# Patient Record
Sex: Male | Born: 1980 | Race: Black or African American | Hispanic: No | Marital: Married | Smoking: Current every day smoker
Health system: Southern US, Community
[De-identification: ages and names within clinical notes are randomized; demographics above are authoritative.]

## PROBLEM LIST (undated history)

## (undated) DIAGNOSIS — Z720 Tobacco use: Secondary | ICD-10-CM

## (undated) DIAGNOSIS — E119 Type 2 diabetes mellitus without complications: Secondary | ICD-10-CM

## (undated) HISTORY — PX: CORNEAL TRANSPLANT: SHX108

## (undated) HISTORY — PX: HERNIA REPAIR: SHX51

---

## 2016-01-12 ENCOUNTER — Encounter (HOSPITAL_BASED_OUTPATIENT_CLINIC_OR_DEPARTMENT_OTHER): Payer: Self-pay

## 2016-01-12 ENCOUNTER — Emergency Department (HOSPITAL_BASED_OUTPATIENT_CLINIC_OR_DEPARTMENT_OTHER)
Admission: EM | Admit: 2016-01-12 | Discharge: 2016-01-12 | Disposition: A | Discharge status: Home / Self Care | Payer: Self-pay | Attending: Physician Assistant | Admitting: Physician Assistant

## 2016-01-12 ENCOUNTER — Emergency Department (HOSPITAL_BASED_OUTPATIENT_CLINIC_OR_DEPARTMENT_OTHER): Payer: Self-pay

## 2016-01-12 DIAGNOSIS — W2105XA Struck by basketball, initial encounter: Secondary | ICD-10-CM | POA: Insufficient documentation

## 2016-01-12 DIAGNOSIS — F17210 Nicotine dependence, cigarettes, uncomplicated: Secondary | ICD-10-CM | POA: Insufficient documentation

## 2016-01-12 DIAGNOSIS — Y9367 Activity, basketball: Secondary | ICD-10-CM | POA: Insufficient documentation

## 2016-01-12 DIAGNOSIS — S62637A Displaced fracture of distal phalanx of left little finger, initial encounter for closed fracture: Secondary | ICD-10-CM | POA: Insufficient documentation

## 2016-01-12 DIAGNOSIS — Y999 Unspecified external cause status: Secondary | ICD-10-CM | POA: Insufficient documentation

## 2016-01-12 DIAGNOSIS — S62609A Fracture of unspecified phalanx of unspecified finger, initial encounter for closed fracture: Secondary | ICD-10-CM

## 2016-01-12 DIAGNOSIS — Y929 Unspecified place or not applicable: Secondary | ICD-10-CM | POA: Insufficient documentation

## 2016-01-12 NOTE — ED Provider Notes (Signed)
[facility] Provider Note   CSN: [id number] Arrival date & time: 01/12/16  1525     History   Chief Complaint Chief Complaint  Patient presents with  . Finger Injury    HPI Jeffery Ray is a 35 y.o. male.  Patient presents to the emergency department with chief complaint of left finger pain. He states that he was playing basketball last night and jammed his finger. Complains of moderate pain. The pain is worsened with palpation and movement. He reports associated swelling and bruising. He has not tried taking anything for his symptoms. There are no other associated symptoms. He denies any hand pain. Denies any numbness, weakness, or tingling.   The history is provided by the patient. No language interpreter was used.    History reviewed. No pertinent past medical history.  There are no active problems to display for this patient.   Past Surgical History:  Procedure Laterality Date  . CORNEAL TRANSPLANT    . HERNIA REPAIR         Home Medications    Prior to Admission medications   Not on File    Family History No family history on file.  Social History Social History  Substance Use Topics  . Smoking status: Current Every Day Smoker  . Smokeless tobacco: Never Used  . Alcohol use No     Allergies   Fish allergy and Shellfish allergy   Review of Systems Review of Systems  Musculoskeletal: Positive for arthralgias.  All other systems reviewed and are negative.    Physical Exam Updated Vital Signs BP 109/72 (BP Location: Right Arm)   Pulse 105   Temp 98.2 F (36.8 C) (Oral)   Resp 18   Ht 6' (1.829 m)   Wt 86.2 kg   SpO2 98%   BMI 25.77 kg/m   Physical Exam Physical Exam  Constitutional: Pt appears well-developed and well-nourished. No distress.  HENT:  Head: Normocephalic and atraumatic.  Eyes: Conjunctivae are normal.  Neck: Normal range of motion.  Cardiovascular: Normal rate, regular rhythm and intact distal pulses.     Capillary refill < 3 sec  Pulmonary/Chest: Effort normal and breath sounds normal.  Musculoskeletal: Pt exhibits tenderness to palpation of left little finger (proximal and middle phalanx). Pt exhibits moderate edema.  ROM: 4/5 limited by pain  Neurological: Pt  is alert. Coordination normal.  Sensation 5/5 Strength 4/5 limited by pain  Skin: Skin is warm and dry. Pt is not diaphoretic.  No tenting of the skin  Psychiatric: Pt has a normal mood and affect.  Nursing note and vitals reviewed.   ED Treatments / Results  Labs (all labs ordered are listed, but only abnormal results are displayed) Labs Reviewed - No data to display  EKG  EKG Interpretation None       Radiology No results found.  Procedures Procedures (including critical care time)  Medications Ordered in ED Medications - No data to display   Initial Impression / Assessment and Plan / ED Course  I have reviewed the triage vital signs and the nursing notes.  Pertinent labs & imaging results that were available during my care of the patient were reviewed by me and considered in my medical decision making (see chart for details).  Clinical Course    Patient with left small finger middle phalanx avulsion fracture. Will give finger splint. Recommend orthopedic follow-up. Tylenol and ibuprofen for pain. Patient is stable and ready for discharge.  Final Clinical Impressions(s) / ED  Diagnoses   Final diagnoses:  Finger fracture, closed, initial encounter    New Prescriptions New Prescriptions   No medications on file     Roxy HorsemanRobert Asmaa Tirpak, PA-C 01/12/16 1613    Courteney Lyn Mackuen, MD 01/12/16 2320

## 2016-01-12 NOTE — ED Notes (Signed)
Pa  at bedside. 

## 2016-01-12 NOTE — ED Notes (Signed)
Patient transported to X-ray 

## 2016-01-12 NOTE — ED Triage Notes (Signed)
Injured left pinky finger yesterday playing basketball-swelling/bruisingNAD-steady gait

## 2016-10-04 ENCOUNTER — Encounter (HOSPITAL_BASED_OUTPATIENT_CLINIC_OR_DEPARTMENT_OTHER): Payer: Self-pay | Admitting: *Deleted

## 2016-10-04 ENCOUNTER — Emergency Department (HOSPITAL_BASED_OUTPATIENT_CLINIC_OR_DEPARTMENT_OTHER): Payer: Self-pay

## 2016-10-04 ENCOUNTER — Emergency Department (HOSPITAL_BASED_OUTPATIENT_CLINIC_OR_DEPARTMENT_OTHER)
Admission: EM | Admit: 2016-10-04 | Discharge: 2016-10-04 | Disposition: A | Discharge status: Home / Self Care | Payer: Self-pay | Attending: Emergency Medicine | Admitting: Emergency Medicine

## 2016-10-04 DIAGNOSIS — F17200 Nicotine dependence, unspecified, uncomplicated: Secondary | ICD-10-CM | POA: Insufficient documentation

## 2016-10-04 DIAGNOSIS — E119 Type 2 diabetes mellitus without complications: Secondary | ICD-10-CM | POA: Insufficient documentation

## 2016-10-04 DIAGNOSIS — M79672 Pain in left foot: Secondary | ICD-10-CM | POA: Insufficient documentation

## 2016-10-04 HISTORY — DX: Type 2 diabetes mellitus without complications: E11.9

## 2016-10-04 MED ORDER — NAPROXEN 250 MG PO TABS
500.0000 mg | ORAL_TABLET | Freq: Once | ORAL | Status: AC
  Administered 2016-10-04: 500 mg via ORAL
  Filled 2016-10-04: qty 2

## 2016-10-04 NOTE — ED Triage Notes (Signed)
Left foot pain onset 4-5 hours,  Denies inj.

## 2016-10-04 NOTE — ED Provider Notes (Signed)
[facility] Provider Note: Jeffery DellJ. Lane Jamira Barfuss, MD, FACEP  CSN: [id number] MRN: [id number] ARRIVAL: 10/04/16 at 0338 ROOM: MH05/[facility]05   CHIEF COMPLAINT  Foot Pain   HISTORY OF PRESENT ILLNESS  Jeffery Ray is a 36 y.o. male with diabetes. 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 He is not aware of any trauma. There is a small associated nodule associated with the pain which he said he has never noticed in the past. He rates his pain as a 7 out of 10 at its worst. Pain is exacerbated with palpation and ambulation. There is no gross deformity or swelling associated. The pain is sharp and well localized.   Past Medical History:  Diagnosis Date  . Diabetes mellitus without complication [facility](HCC)     Past Surgical History:  Procedure Laterality Date  . CORNEAL TRANSPLANT    . HERNIA REPAIR      No family history on file.  Social History  Substance Use Topics  . Smoking status: Current Every Day Smoker  . Smokeless tobacco: Never Used  . Alcohol use No    Prior to Admission medications   Not on File    Allergies Fish allergy and Shellfish allergy   REVIEW OF SYSTEMS  Negative except as noted here or in the History of Present Illness.   PHYSICAL EXAMINATION  Initial Vital Signs Blood pressure 112/75, pulse 70, temperature 97.6 F (36.4 C), temperature source Oral, resp. rate 16, height 6' (1.829 m), weight 190 lb (86.2 kg), SpO2 98 %.  Examination General: Well-developed, well-nourished male in no acute distress; appearance consistent with age of record HENT: normocephalic; atraumatic Eyes: Left pupil round and reactive to light; right pupil obscured by corneal opacity; mild right lateral strabismus Neck: supple Heart: regular rate and rhythm Lungs: clear to auscultation bilaterally Abdomen: soft; nondistended; nontender; bowel sounds present Extremities: No deformity;  full range of motion; pulses normal; tenderness of mid dorsal left foot at site of small firm nodule Neurologic: Awake, alert and oriented; motor function intact in all extremities and symmetric; no facial droop Skin: Warm and dry Psychiatric: Normal mood and affect   RESULTS  Summary of this visit's results, reviewed by myself:   EKG Interpretation  Date/Time:    Ventricular Rate:    PR Interval:    QRS Duration:   QT Interval:    QTC Calculation:   R Axis:     Text Interpretation:        Laboratory Studies: No results found for this or any previous visit (from the past 24 hour(s)). Imaging Studies: Dg Foot Complete Left  Result Date: 10/04/2016 CLINICAL DATA:  Left midfoot pain for 2 hours.  No trauma. EXAM: LEFT FOOT - COMPLETE 3+ VIEW COMPARISON:  None. FINDINGS: There is no evidence of fracture or dislocation. There is no evidence of arthropathy or other focal bone abnormality. Soft tissues are unremarkable. IMPRESSION: No fracture or dislocation of the left foot. Electronically Signed   By: Deatra RobinsonKevin  Herman M.D.   On: 10/04/2016 04:58    ED COURSE  Nursing notes and initial vitals signs, including pulse oximetry, reviewed.  Vitals:   10/04/16 0350 10/04/16 0351  BP: 112/75   Pulse: 70   Resp: 16   Temp: 97.6 F (36.4 C)   TempSrc: Oral   SpO2: 98%   Weight:  190 lb (86.2 kg)  Height:  6' (1.829 m)  Will refer to podiatry.  PROCEDURES    ED DIAGNOSES     ICD-9-CM ICD-10-CM   1. Foot pain, left 729.5 M79.672        Paula Libra, MD 10/04/16 [phone]

## 2016-11-10 ENCOUNTER — Encounter (HOSPITAL_BASED_OUTPATIENT_CLINIC_OR_DEPARTMENT_OTHER): Payer: Self-pay

## 2016-11-10 DIAGNOSIS — F17210 Nicotine dependence, cigarettes, uncomplicated: Secondary | ICD-10-CM | POA: Diagnosis not present

## 2016-11-10 DIAGNOSIS — R42 Dizziness and giddiness: Secondary | ICD-10-CM | POA: Insufficient documentation

## 2016-11-10 DIAGNOSIS — N289 Disorder of kidney and ureter, unspecified: Secondary | ICD-10-CM | POA: Insufficient documentation

## 2016-11-10 LAB — CBG MONITORING, ED: Glucose-Capillary: 91 mg/dL (ref 65–99)

## 2016-11-10 NOTE — ED Triage Notes (Signed)
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

## 2016-11-11 ENCOUNTER — Emergency Department (HOSPITAL_BASED_OUTPATIENT_CLINIC_OR_DEPARTMENT_OTHER)
Admission: EM | Admit: 2016-11-11 | Discharge: 2016-11-11 | Disposition: A | Discharge status: Home / Self Care | Payer: Managed Care, Other (non HMO) | Attending: Emergency Medicine | Admitting: Emergency Medicine

## 2016-11-11 ENCOUNTER — Emergency Department (HOSPITAL_COMMUNITY): Payer: Managed Care, Other (non HMO)

## 2016-11-11 DIAGNOSIS — R42 Dizziness and giddiness: Secondary | ICD-10-CM

## 2016-11-11 DIAGNOSIS — N289 Disorder of kidney and ureter, unspecified: Secondary | ICD-10-CM

## 2016-11-11 LAB — COMPREHENSIVE METABOLIC PANEL
ALT: 23 U/L (ref 17–63)
AST: 18 U/L (ref 15–41)
Albumin: 3.9 g/dL (ref 3.5–5.0)
Alkaline Phosphatase: 67 U/L (ref 38–126)
Anion gap: 5 (ref 5–15)
BUN: 22 mg/dL — ABNORMAL HIGH (ref 6–20)
CHLORIDE: 109 mmol/L (ref 101–111)
CO2: 27 mmol/L (ref 22–32)
Calcium: 8.5 mg/dL — ABNORMAL LOW (ref 8.9–10.3)
Creatinine, Ser: 1.48 mg/dL — ABNORMAL HIGH (ref 0.61–1.24)
GFR, EST NON AFRICAN AMERICAN: 59 mL/min — AB (ref >60)
Glucose, Bld: 85 mg/dL (ref 65–99)
POTASSIUM: 3.6 mmol/L (ref 3.5–5.1)
SODIUM: 141 mmol/L (ref 135–145)
Total Bilirubin: 1.5 mg/dL — ABNORMAL HIGH (ref 0.3–1.2)
Total Protein: 6.8 g/dL (ref 6.5–8.1)

## 2016-11-11 LAB — CBC WITH DIFFERENTIAL/PLATELET
BASOS ABS: 0 10*3/uL (ref 0.0–0.1)
Basophils Relative: 1 %
EOS ABS: 0.1 10*3/uL (ref 0.0–0.7)
EOS PCT: 2 %
HCT: 42.6 % (ref 39.0–52.0)
Hemoglobin: 14.9 g/dL (ref 13.0–17.0)
LYMPHS ABS: 2.6 10*3/uL (ref 0.7–4.0)
LYMPHS PCT: 42 %
MCH: 31 pg (ref 26.0–34.0)
MCHC: 35 g/dL (ref 30.0–36.0)
MCV: 88.6 fL (ref 78.0–100.0)
Monocytes Absolute: 0.4 10*3/uL (ref 0.1–1.0)
Monocytes Relative: 7 %
Neutro Abs: 3 10*3/uL (ref 1.7–7.7)
Neutrophils Relative %: 48 %
PLATELETS: 161 10*3/uL (ref 150–400)
RBC: 4.81 MIL/uL (ref 4.22–5.81)
RDW: 13.3 % (ref 11.5–15.5)
WBC: 6.3 10*3/uL (ref 4.0–10.5)

## 2016-11-11 LAB — TROPONIN I

## 2016-11-11 MED ORDER — SODIUM CHLORIDE 0.9 % IV BOLUS (SEPSIS)
1000.0000 mL | Freq: Once | INTRAVENOUS | Status: AC
  Administered 2016-11-11: 1000 mL via INTRAVENOUS

## 2016-11-11 MED ORDER — DIAZEPAM 5 MG PO TABS
5.0000 mg | ORAL_TABLET | Freq: Once | ORAL | Status: AC
  Administered 2016-11-11: 5 mg via ORAL
  Filled 2016-11-11: qty 1

## 2016-11-11 MED ORDER — MECLIZINE HCL 25 MG PO TABS: 25.0000 mg | ORAL_TABLET | Freq: Three times a day (TID) | ORAL | 0 refills | Status: DC | PRN

## 2016-11-11 MED ORDER — ONDANSETRON HCL 4 MG/2ML IJ SOLN
4.0000 mg | Freq: Once | INTRAMUSCULAR | Status: AC
  Administered 2016-11-11: 4 mg via INTRAVENOUS
  Filled 2016-11-11: qty 2

## 2016-11-11 MED ORDER — MECLIZINE HCL 25 MG PO TABS
25.0000 mg | ORAL_TABLET | Freq: Once | ORAL | Status: AC
  Administered 2016-11-11: 25 mg via ORAL
  Filled 2016-11-11: qty 1

## 2016-11-11 NOTE — ED Provider Notes (Signed)
[facility] Provider Note   CSN: [id number] Arrival date & time: 11/10/16  2239     History   Chief Complaint Chief Complaint  Patient presents with  . Dizziness    HPI Jeffery Ray is a 36 y.o. male.  The history is provided by the patient.  He complains of feeling dizzy headed and nauseous all day long. This this is described as a sense of things spinning. He has not vomited. He does feel worse when standing. At one point, he tried to sit down and states that he had loss of consciousness. He denies tinnitus or ear pain. There is been no treatment prior to coming to the ED. He denies any chest pain, heaviness, tightness, pressure. He is a cigarette smoker admitting to smoking 1 pack of cigarettes a day. He denies history of diabetes, hypertension, hyperlipidemia. There is no family history of premature coronary atherosclerosis.  Past Medical History:  Diagnosis Date  . Diabetes mellitus without complication (HCC)     There are no active problems to display for this patient.   Past Surgical History:  Procedure Laterality Date  . CORNEAL TRANSPLANT    . HERNIA REPAIR         Home Medications    Prior to Admission medications   Not on File    Family History No family history on file.  Social History Social History  Substance Use Topics  . Smoking status: Current Every Day Smoker  . Smokeless tobacco: Never Used  . Alcohol use No     Allergies   Fish allergy and Shellfish allergy   Review of Systems Review of Systems  All other systems reviewed and are negative.    Physical Exam Updated Vital Signs BP (!) 127/91   Pulse 61   Temp 98.3 F (36.8 C) (Oral)   Resp 18   Ht 6' (1.829 m)   Wt 81.6 kg (180 lb)   SpO2 100%   BMI 24.41 kg/m   Physical Exam  Nursing note and vitals reviewed.  36 year old male, resting comfortably and in no acute distress. Vital signs are significant for borderline hypertension. Oxygen saturation is  100%, which is normal. Head is normocephalic and atraumatic. Left pupil responds to light. Right cornea is completely opacified. EOMI. Oropharynx is clear. Neck is nontender and supple without adenopathy or JVD. Back is nontender and there is no CVA tenderness. Lungs are clear without rales, wheezes, or rhonchi. Chest is nontender. Heart has regular rate and rhythm without murmur. Abdomen is soft, flat, nontender without masses or hepatosplenomegaly and peristalsis is normoactive. Extremities have no cyanosis or edema, full range of motion is present. Skin is warm and dry without rash. Neurologic: Mental status is normal, cranial nerves are intact, there are no motor or sensory deficits. Dizziness is reproduced by passive head movement.  ED Treatments / Results  Labs (all labs ordered are listed, but only abnormal results are displayed) Labs Reviewed  COMPREHENSIVE METABOLIC PANEL - Abnormal; Notable for the following:       Result Value   BUN 22 (*)    Creatinine, Ser 1.48 (*)    Calcium 8.5 (*)    Total Bilirubin 1.5 (*)    GFR calc non Af Amer 59 (*)    All other components within normal limits  CBC WITH DIFFERENTIAL/PLATELET  TROPONIN I  CBG MONITORING, ED    EKG  EKG Interpretation  Date/Time:  Thursday November 10 2016 23:00:34 EDT Ventricular Rate:  63 PR Interval:  118 QRS Duration: 86 QT Interval:  380 QTC Calculation: 388 R Axis:   79 Text Interpretation:  Normal sinus rhythm Normal ECG No old tracing to compare Confirmed by Dione Booze ([id number]) on 11/10/2016 11:05:33 PM       Radiology No results found.  Procedures Procedures (including critical care time)  Medications Ordered in ED Medications  sodium chloride 0.9 % bolus 1,000 mL (0 mLs Intravenous Stopped 11/11/16 0136)  sodium chloride 0.9 % bolus 1,000 mL (0 mLs Intravenous Stopped 11/11/16 0330)  ondansetron (ZOFRAN) injection 4 mg (4 mg Intravenous Given 11/11/16 0236)  meclizine (ANTIVERT) tablet 25  mg (25 mg Oral Given 11/11/16 0236)     Initial Impression / Assessment and Plan / ED Course  I have reviewed the triage vital signs and the nursing notes.  Pertinent labs & imaging results that were available during my care of the patient were reviewed by me and considered in my medical decision making (see chart for details).  Dizziness which appears to be peripheral vertigo. Syncopal episode of uncertain cause, but I actually think this is related to his vertigo. Laboratory workup does show elevated creatinine with no prior values in the system. Patient states that he has not had blood work to check his kidneys that he is aware of. ECG is normal. We'll check troponin. He will be given IV fluids, ondansetron, meclizine.  Orthostatic vital signs and no change in pulse or blood pressure. He had no relief whatsoever with above noted treatment. At this point, I feel MRI is needed to rule out posterior circulation stroke. MRI is not available here today. He will be transferred to [facility] for MRI scan. Case is discussed with Dr. Elesa Massed in the ED who agrees to accept the patient.  Final Clinical Impressions(s) / ED Diagnoses   Final diagnoses:  Dizziness  Renal insufficiency    New Prescriptions New Prescriptions   No medications on file     Dione Booze, MD 11/11/16 [phone]

## 2016-11-11 NOTE — ED Provider Notes (Signed)
6:00 AM  Pt sent [facility] from [facility] by Dr. Preston FleetingGlick for an MRI of the brain to rule out posterior circulation abnormality causing patient to have vertigo. Labs unremarkable other than a creatinine of 1.48. Unclear what his baseline is. He has received IV hydration. EKG normal. Hemodynamically stable. Patient received 2 L of IV fluids, 4 mg of IV Zofran 25 mg of oral meclizine without significant relief.  Here patient complains of continued vertigo. He did previously have tinnitus but this has resolved. No ear pain or hearing loss. No recent head injury. No headache. Is complaining of left-sided facial, arm and leg numbness. No weakness on exam. Symptoms started before 10 PM last night. He is outside of any TPA window. He has no history of hypertension, diabetes, hyperlipidemia, previous TIA or CVA. He is a smoker.  7:35 AM  Pt still awaiting MRI of his brain. I feel if MRI is negative and patient's symptoms are controlled and he is able to drink and ambulate without significant assistance that he can be discharged home with outpatient follow-up and medications for vertigo. Signed out to Dr. Rubin PayorPickering who will follow-up on patient's MRI and reassess his symptoms.  I reviewed all nursing notes, vitals, pertinent previous records, EKGs, lab and urine results, imaging (as available).    Ward, Layla MawKristen N, DO 11/11/16 [phone]

## 2016-11-11 NOTE — ED Notes (Signed)
[facility]/o dizziness onset yest, states he is blink in right eye, and has blurred vision in his left eye. States it feels like the room is spinning. Family at bedside.

## 2016-11-11 NOTE — ED Notes (Addendum)
Pt returns from MRI and is speaking with daughter on phone. Wife at bedside.

## 2016-11-11 NOTE — ED Notes (Signed)
Pt taken to MRI  

## 2016-11-11 NOTE — ED Notes (Signed)
ED Provider at bedside. 

## 2016-11-11 NOTE — Discharge Instructions (Addendum)
To find a primary care or specialty doctor please call [phone] or [phone] to access "[facility] Find a Doctor Service." ° °You may also go on the [facility] website at [email or web address] ° °There are also multiple [facility], [facility] and [facility] practices throughout the [place] that are frequently accepting new patients. You may find a clinic that is close to your home and contact them. ° °[facility] -  °[address] °[place] [address] °[phone] ° ° °[facility] -  °[address] °[place] [address] [phone] ° ° °[facility] - °[address]  °[place] [address] °[phone] ° ° °

## 2016-11-11 NOTE — ED Provider Notes (Signed)
  Physical Exam  BP 109/80   Pulse 61   Temp 98.4 F (36.9 C)   Resp 15   Ht 6' (1.829 m)   Wt 81.6 kg (180 lb)   SpO2 100%   BMI 24.41 kg/m   Physical Exam  ED Course  Procedures  MDM MRI back reassuring. Patient somewhat sedated still from the Valium. Discussed results with patient's wife. Will start home with Antivert. Will follow-up with PCP as needed.       Jeffery CorePickering, [name], MD 11/11/16 1037

## 2016-12-28 ENCOUNTER — Encounter (HOSPITAL_COMMUNITY): Payer: Self-pay | Admitting: Emergency Medicine

## 2016-12-28 ENCOUNTER — Inpatient Hospital Stay (HOSPITAL_COMMUNITY)
Admission: EM | Admit: 2016-12-28 | Discharge: 2017-01-01 | DRG: 149 | Disposition: A | Discharge status: Home / Self Care | Payer: Managed Care, Other (non HMO) | Attending: Internal Medicine | Admitting: Internal Medicine

## 2016-12-28 ENCOUNTER — Emergency Department (HOSPITAL_COMMUNITY): Payer: Managed Care, Other (non HMO)

## 2016-12-28 DIAGNOSIS — N289 Disorder of kidney and ureter, unspecified: Secondary | ICD-10-CM

## 2016-12-28 DIAGNOSIS — H5500 Unspecified nystagmus: Secondary | ICD-10-CM | POA: Diagnosis present

## 2016-12-28 DIAGNOSIS — Z833 Family history of diabetes mellitus: Secondary | ICD-10-CM

## 2016-12-28 DIAGNOSIS — M6282 Rhabdomyolysis: Secondary | ICD-10-CM | POA: Diagnosis present

## 2016-12-28 DIAGNOSIS — N179 Acute kidney failure, unspecified: Secondary | ICD-10-CM | POA: Diagnosis not present

## 2016-12-28 DIAGNOSIS — R402132 Coma scale, eyes open, to sound, at arrival to emergency department: Secondary | ICD-10-CM | POA: Diagnosis present

## 2016-12-28 DIAGNOSIS — R42 Dizziness and giddiness: Principal | ICD-10-CM | POA: Diagnosis present

## 2016-12-28 DIAGNOSIS — H5462 Unqualified visual loss, left eye, normal vision right eye: Secondary | ICD-10-CM | POA: Diagnosis present

## 2016-12-28 DIAGNOSIS — R402332 Coma scale, best motor response, abnormal, at arrival to emergency department: Secondary | ICD-10-CM | POA: Diagnosis present

## 2016-12-28 DIAGNOSIS — R569 Unspecified convulsions: Secondary | ICD-10-CM

## 2016-12-28 DIAGNOSIS — Z720 Tobacco use: Secondary | ICD-10-CM | POA: Diagnosis present

## 2016-12-28 DIAGNOSIS — G92 Toxic encephalopathy: Secondary | ICD-10-CM | POA: Diagnosis present

## 2016-12-28 DIAGNOSIS — R402212 Coma scale, best verbal response, none, at arrival to emergency department: Secondary | ICD-10-CM | POA: Diagnosis present

## 2016-12-28 DIAGNOSIS — Z91013 Allergy to seafood: Secondary | ICD-10-CM

## 2016-12-28 DIAGNOSIS — G40901 Epilepsy, unspecified, not intractable, with status epilepticus: Secondary | ICD-10-CM

## 2016-12-28 DIAGNOSIS — N182 Chronic kidney disease, stage 2 (mild): Secondary | ICD-10-CM | POA: Diagnosis present

## 2016-12-28 DIAGNOSIS — E1122 Type 2 diabetes mellitus with diabetic chronic kidney disease: Secondary | ICD-10-CM | POA: Diagnosis present

## 2016-12-28 DIAGNOSIS — F17200 Nicotine dependence, unspecified, uncomplicated: Secondary | ICD-10-CM | POA: Diagnosis present

## 2016-12-28 DIAGNOSIS — H519 Unspecified disorder of binocular movement: Secondary | ICD-10-CM

## 2016-12-28 DIAGNOSIS — I252 Old myocardial infarction: Secondary | ICD-10-CM

## 2016-12-28 HISTORY — DX: Tobacco use: Z72.0

## 2016-12-28 LAB — HEPATIC FUNCTION PANEL
ALBUMIN: 3.6 g/dL (ref 3.5–5.0)
ALK PHOS: 77 U/L (ref 38–126)
ALT: 29 U/L (ref 17–63)
AST: 30 U/L (ref 15–41)
BILIRUBIN DIRECT: 0.4 mg/dL (ref 0.1–0.5)
BILIRUBIN INDIRECT: 0.9 mg/dL (ref 0.3–0.9)
BILIRUBIN TOTAL: 1.3 mg/dL — AB (ref 0.3–1.2)
TOTAL PROTEIN: 6 g/dL — AB (ref 6.5–8.1)

## 2016-12-28 LAB — CBC WITH DIFFERENTIAL/PLATELET
BASOS PCT: 0 %
Basophils Absolute: 0 10*3/uL (ref 0.0–0.1)
EOS ABS: 0.1 10*3/uL (ref 0.0–0.7)
EOS PCT: 2 %
HCT: 42.8 % (ref 39.0–52.0)
HEMOGLOBIN: 14.3 g/dL (ref 13.0–17.0)
Lymphocytes Relative: 39 %
Lymphs Abs: 2.3 10*3/uL (ref 0.7–4.0)
MCH: 30.1 pg (ref 26.0–34.0)
MCHC: 33.4 g/dL (ref 30.0–36.0)
MCV: 90.1 fL (ref 78.0–100.0)
Monocytes Absolute: 0.5 10*3/uL (ref 0.1–1.0)
Monocytes Relative: 9 %
NEUTROS PCT: 50 %
Neutro Abs: 2.9 10*3/uL (ref 1.7–7.7)
PLATELETS: 169 10*3/uL (ref 150–400)
RBC: 4.75 MIL/uL (ref 4.22–5.81)
RDW: 13.8 % (ref 11.5–15.5)
WBC: 5.9 10*3/uL (ref 4.0–10.5)

## 2016-12-28 LAB — I-STAT ARTERIAL BLOOD GAS, ED
Bicarbonate: 26.3 mmol/L (ref 20.0–28.0)
O2 SAT: 85 %
TCO2: 28 mmol/L (ref 0–100)
pCO2 arterial: 47.6 mmHg (ref 32.0–48.0)
pH, Arterial: 7.351 (ref 7.350–7.450)
pO2, Arterial: 52 mmHg — ABNORMAL LOW (ref 83.0–108.0)

## 2016-12-28 LAB — I-STAT TROPONIN, ED: Troponin i, poc: 0 ng/mL (ref 0.00–0.08)

## 2016-12-28 LAB — I-STAT CHEM 8, ED
BUN: 23 mg/dL — AB (ref 6–20)
CHLORIDE: 106 mmol/L (ref 101–111)
Calcium, Ion: 1.11 mmol/L — ABNORMAL LOW (ref 1.15–1.40)
Creatinine, Ser: 1.4 mg/dL — ABNORMAL HIGH (ref 0.61–1.24)
Glucose, Bld: 79 mg/dL (ref 65–99)
HEMATOCRIT: 46 % (ref 39.0–52.0)
Hemoglobin: 15.6 g/dL (ref 13.0–17.0)
Potassium: 3.9 mmol/L (ref 3.5–5.1)
SODIUM: 144 mmol/L (ref 135–145)
TCO2: 28 mmol/L (ref 0–100)

## 2016-12-28 LAB — CK: CK TOTAL: 449 U/L — AB (ref 49–397)

## 2016-12-28 MED ORDER — ONDANSETRON HCL 4 MG/2ML IJ SOLN: 4.0000 mg | Freq: Three times a day (TID) | INTRAMUSCULAR | Status: DC | PRN

## 2016-12-28 MED ORDER — SODIUM CHLORIDE 0.9 % IV SOLN
1000.0000 mg | Freq: Once | INTRAVENOUS | Status: AC
  Administered 2016-12-28: 1000 mg via INTRAVENOUS
  Filled 2016-12-28: qty 10

## 2016-12-28 MED ORDER — NICOTINE 21 MG/24HR TD PT24
21.0000 mg | MEDICATED_PATCH | Freq: Every day | TRANSDERMAL | Status: DC
  Administered 2016-12-29 – 2017-01-01 (×4): 21 mg via TRANSDERMAL
  Filled 2016-12-28 (×4): qty 1

## 2016-12-28 MED ORDER — ENOXAPARIN SODIUM 40 MG/0.4ML ~~LOC~~ SOLN
40.0000 mg | Freq: Every day | SUBCUTANEOUS | Status: DC
  Administered 2016-12-30 – 2017-01-01 (×3): 40 mg via SUBCUTANEOUS
  Filled 2016-12-28 (×4): qty 0.4

## 2016-12-28 MED ORDER — ACETAMINOPHEN 650 MG RE SUPP: 650.0000 mg | Freq: Four times a day (QID) | RECTAL | Status: DC | PRN

## 2016-12-28 MED ORDER — SODIUM CHLORIDE 0.9 % IV SOLN
INTRAVENOUS | Status: DC
Start: 2016-12-28 — End: 2017-01-01
  Administered 2016-12-28 – 2016-12-31 (×4): via INTRAVENOUS

## 2016-12-28 MED ORDER — LORAZEPAM 2 MG/ML IJ SOLN
INTRAMUSCULAR | Status: AC
  Filled 2016-12-28: qty 1

## 2016-12-28 MED ORDER — SODIUM CHLORIDE 0.9 % IV BOLUS (SEPSIS)
500.0000 mL | Freq: Once | INTRAVENOUS | Status: AC
  Administered 2016-12-28: 500 mL via INTRAVENOUS

## 2016-12-28 MED ORDER — ACETAMINOPHEN 325 MG PO TABS
650.0000 mg | ORAL_TABLET | Freq: Four times a day (QID) | ORAL | Status: DC | PRN
  Administered 2016-12-30: 650 mg via ORAL
  Filled 2016-12-28: qty 2

## 2016-12-28 MED ORDER — LORAZEPAM 2 MG/ML IJ SOLN: 1.0000 mg | INTRAMUSCULAR | Status: DC | PRN

## 2016-12-28 MED ORDER — LORAZEPAM 2 MG/ML IJ SOLN
1.0000 mg | Freq: Once | INTRAMUSCULAR | Status: AC
  Administered 2016-12-28: 1 mg via INTRAVENOUS

## 2016-12-28 MED ORDER — LORAZEPAM 2 MG/ML IJ SOLN
1.0000 mg | Freq: Once | INTRAMUSCULAR | Status: AC
  Administered 2016-12-28: 1 mg via INTRAVENOUS
  Filled 2016-12-28: qty 1

## 2016-12-28 NOTE — ED Provider Notes (Signed)
Seen on arrival level05 caveat. Patient unresponsive Patient unresponsive history is obtained from paramedics and from his wife. He had one or 2 seizures in the field. Brought by EMS. No treatment in the field.. No past medical history except for "heart attack" 6 years ago. His wife reports that he does not use illegal drugs or alcohol. On exam patient unresponsive Glasgow Coma Score 3 eyes twitching consistent with seizure activity. 9:20 PM M After treatment with IV Ativan patient opens eyes to verbal stimulus. Does not follow simple commands. Gag reflex is intact. He is nonverbal. Purposeful movement to noxious stimulus. Glasgow Coma Score equals 9   Doug SouJacubowitz, Orah Sonnen, MD 12/28/16 2130

## 2016-12-28 NOTE — ED Notes (Signed)
Pt able to communicate that he is cold. Pt's speech very low. Pt alert and oriented to self. Pt stated he was at work when he got dizzy, took a step back and fell. Pt states he hit the back of his head. Pt complaining of headache 8/10.

## 2016-12-28 NOTE — ED Provider Notes (Addendum)
[facility]-EMERGENCY DEPT Provider Note   CSN: [id number] Arrival date & time: 12/28/16  2039     History   Chief Complaint Chief Complaint  Patient presents with  . Seizures    HPI Jeffery Ray is a 36 y.o. male.  -year-old male brought from work after having 2 reported episodes of seizure-like movements with a post ictal unresponsive phase.  EMS gave him Versed and he had a short period of responsiveness.  On arrival to the emergency room.  He is again unresponsive and seizing- no movement of the extremities, is unresponsive and having nystagmus. Wife at bedside reports that in 2011.  He had a mild heart attack.  He also has episodes of low blood sugar.  He has been healthy, eating well.  Today he was more sleepy than normal and suffer a longer period of time.  He also had one episode of diarrhea, on arrival to work at 6 PM.       Past Medical History:  Diagnosis Date  . Diabetes mellitus without complication (HCC)   . Tobacco abuse     Patient Active Problem List   Diagnosis Date Noted  . Seizure (HCC) 12/28/2016  . Tobacco abuse 12/28/2016  . AKI (acute kidney injury) (HCC) 12/28/2016  . Rhabdomyolysis 12/28/2016    Past Surgical History:  Procedure Laterality Date  . CORNEAL TRANSPLANT    . HERNIA REPAIR         Home Medications    Prior to Admission medications   Medication Sig Start Date End Date Taking? Authorizing Provider  nicotine (NICODERM CQ - DOSED IN MG/24 HOURS) 21 mg/24hr patch Place 1 patch (21 mg total) onto the skin daily. 01/02/17   Edsel Petrin, DO    Family History Family History  Problem Relation Age of Onset  . Diabetes Mellitus II Mother     Social History Social History  Substance Use Topics  . Smoking status: Current Every Day Smoker    Packs/day: 1.00    Years: 10.00    Types: Cigarettes  . Smokeless tobacco: Never Used  . Alcohol use No     Allergies   Fish allergy and Shellfish allergy   Review of Systems Review  of Systems  Unable to perform ROS: Patient unresponsive  Constitutional: Negative for fever.  Neurological: Positive for seizures.  All other systems reviewed and are negative.    Physical Exam Updated Vital Signs BP 122/74 (BP Location: Right Arm)   Pulse 72   Temp 98.4 F (36.9 C) (Oral)   Resp 20   Ht 6' (1.829 m)   Wt 85.7 kg (188 lb 15 oz)   SpO2 100%   BMI 25.62 kg/m   Physical Exam  Constitutional: He appears well-developed and well-nourished.  Eyes: Right eye exhibits nystagmus. Left eye exhibits nystagmus.    Cardiovascular: Normal rate and regular rhythm.   Pulmonary/Chest: Effort normal and breath sounds normal.  Nursing note and vitals reviewed.    ED Treatments / Results  Labs (all labs ordered are listed, but only abnormal results are displayed) Labs Reviewed  HEPATIC FUNCTION PANEL - Abnormal; Notable for the following:       Result Value   Total Protein 6.0 (*)    Total Bilirubin 1.3 (*)    All other components within normal limits  RAPID URINE DRUG SCREEN, HOSP PERFORMED - Abnormal; Notable for the following:    Benzodiazepines POSITIVE (*)    All other components within normal limits  CK - Abnormal;  Notable for the following:    Total CK 449 (*)    All other components within normal limits  BASIC METABOLIC PANEL - Abnormal; Notable for the following:    Creatinine, Ser 1.25 (*)    Calcium 8.6 (*)    All other components within normal limits  CBC - Abnormal; Notable for the following:    Platelets 147 (*)    All other components within normal limits  BASIC METABOLIC PANEL - Abnormal; Notable for the following:    Glucose, Bld 109 (*)    Creatinine, Ser 1.37 (*)    Calcium 8.3 (*)    All other components within normal limits  GLUCOSE, CAPILLARY - Abnormal; Notable for the following:    Glucose-Capillary 103 (*)    All other components within normal limits  BASIC METABOLIC PANEL - Abnormal; Notable for the following:    Creatinine, Ser  1.26 (*)    Calcium 8.4 (*)    All other components within normal limits  GLUCOSE, CAPILLARY - Abnormal; Notable for the following:    Glucose-Capillary 137 (*)    All other components within normal limits  BASIC METABOLIC PANEL - Abnormal; Notable for the following:    Glucose, Bld 103 (*)    Creatinine, Ser 1.27 (*)    Calcium 8.5 (*)    All other components within normal limits  I-STAT CHEM 8, ED - Abnormal; Notable for the following:    BUN 23 (*)    Creatinine, Ser 1.40 (*)    Calcium, Ion 1.11 (*)    All other components within normal limits  I-STAT ARTERIAL BLOOD GAS, ED - Abnormal; Notable for the following:    pO2, Arterial 52.0 (*)    All other components within normal limits  CBC WITH DIFFERENTIAL/PLATELET  CK  CREATININE, URINE, RANDOM  SODIUM, URINE, RANDOM  HIV ANTIBODY (ROUTINE TESTING)  CBC  GLUCOSE, CAPILLARY  CBC  GLUCOSE, CAPILLARY  I-STAT TROPONIN, ED    EKG  EKG Interpretation  Date/Time:  Wednesday December 28 2016 20:55:13 EDT Ventricular Rate:  72 PR Interval:    QRS Duration: 89 QT Interval:  346 QTC Calculation: 379 R Axis:   80 Text Interpretation:  Sinus rhythm ST elev, probable normal early repol pattern No significant change since last tracing Confirmed by Doug Sou [phone]) on 12/28/2016 9:31:23 PM Also confirmed by Doug Sou [phone]), editor Elita Quick ([id number])  on 12/29/2016 8:49:35 AM       Radiology No results found.  Procedures .Critical Care Performed by: Earley Favor Authorized by: Earley Favor   Critical care provider statement:    Critical care start time:  12/28/2016 8:40 PM   Critical care end time:  12/28/2016 10:39 PM   Critical care was necessary to treat or prevent imminent or life-threatening deterioration of the following conditions:  CNS failure or compromise   Critical care was time spent personally by me on the following activities:  Development of treatment plan with patient or surrogate, evaluation  of patient's response to treatment, examination of patient, obtaining history from patient or surrogate, ordering and performing treatments and interventions, ordering and review of laboratory studies, pulse oximetry, ordering and review of radiographic studies and re-evaluation of patient's condition     (including critical care time)  Medications Ordered in ED Medications  LORazepam (ATIVAN) injection 1 mg (1 mg Intravenous Given 12/28/16 2054)  sodium chloride 0.9 % bolus 500 mL (0 mLs Intravenous Stopped 12/28/16 2147)  levETIRAcetam (KEPPRA) 1,000 mg in sodium  chloride 0.9 % 100 mL IVPB (0 mg Intravenous Stopped 12/28/16 2200)  LORazepam (ATIVAN) injection 1 mg (1 mg Intravenous Given 12/28/16 2101)  gadobenate dimeglumine (MULTIHANCE) injection 15 mL (15 mLs Intravenous Contrast Given 12/29/16 0125)  metoCLOPramide (REGLAN) injection 10 mg (10 mg Intravenous Given 12/30/16 2350)  diphenhydrAMINE (BENADRYL) injection 25 mg (25 mg Intravenous Given 12/30/16 2350)  iopamidol (ISOVUE-370) 76 % injection (50 mLs Intravenous Contrast Given 12/31/16 2019)     Initial Impression / Assessment and Plan / ED Course  I have reviewed the triage vital signs and the nursing notes.  Pertinent labs & imaging results that were available during my care of the patient were reviewed by me and considered in my medical decision making (see chart for details).      She has been given 2 mg of Ativan, approximate 10 minutes apart shortly after second milligram of Ativan at 905.  Patient stopped seizing and was responsive to his name. 9:26 patient able to answer question with nod of head answers yes to headache  Final Clinical Impressions(s) / ED Diagnoses   Final diagnoses:  Status epilepticus [facility](HCC)  Renal insufficiency    New Prescriptions Discharge Medication List as of 01/01/2017  2:26 PM    START taking these medications   Details  nicotine (NICODERM CQ - DOSED IN MG/24 HOURS) 21 mg/24hr patch Place 1  patch (21 mg total) onto the skin daily., Starting Mon 01/02/2017, Normal         Earley FavorSchulz, [facility], NP 12/28/16 2239    Doug SouJacubowitz, Sam, MD 12/28/16 2250    Earley FavorSchulz, Yitzhak Awan, NP 01/10/17 Rushie [id number]    Doug SouJacubowitz, Sam, MD 01/14/17 [phone]

## 2016-12-28 NOTE — ED Notes (Signed)
Pt had episode of seizing at this time, Pt given 1mg  ativan per verbal order.

## 2016-12-28 NOTE — Consult Note (Signed)
Neurology Consultation  Reason for Consult: Seizure Referring Physician: Dr. Haze Justin, NP  CC: Seizure  History is obtained from: Chart, patient's wife, emergency room team   HPI: Jeffery Ray is a 36 y.o. male with past medical history of diabetes and tobacco use who was in his usual state of health at work today when he was noted to have 2 episodes of seizures followed by lethargy and confusion. One of his work colleagues was present at the bedside and said that the patient had seizures at work, he could not see what exactly was going on because the patient was surrounded by a lot of people were trying to help. EMS was called and he was brought in to the emergency room for evaluation. In the emergency room, the emergency room team noted that he was unresponsive and at some point he started having uncontrolled eye movements with some twitching of the neck muscles. There was no shaking or stiffening of his body, either upper or lower extremities. There is no tongue bite. There is no report of loss of bowel or bladder. He continued to be very lethargic and very difficult to arouse. The "nystagmus" type movements of his eyes lasted for about 20-25 minutes and required 2 doses of Ativan 1 mg for it to resolve.  Patient's wife denies any knowledge of patient using any drugs. She reports that he drinks multiple coffees a day and adds some supplemental shots containing caffeine in his coffee multiple times a day. He also consumes a lot of energy drinks such as RedB[employer].  According to the wife, he sleeps only 2-4 hours each night and works nearly 20 hours a day. The patient and his wife are from [place], currently living in [facility] [place] as the patient is involved in a custody suit for the custody of his children, which is a source of constant stress in his life.  He was seen in the ER on 11/11/2016 for complaints of dizziness/vertigo. He was evaluated with a MRI of the brain  without contrast, that did not show any evidence of a posterior circulation stroke or any other abnormality. He was discharged home with when necessary medications for dizziness. According to the wife, he did not complain of continued dizziness. He was not sick prior to presentation today. No fevers, chills. No chest pain shortness of breath. No nausea vomiting or abdominal pain.   ROS: A 14 point ROS was performed and is negative except as noted in the HPI.    Past Medical History:  Diagnosis Date  . Diabetes mellitus without complication (HCC)   . Tobacco abuse     Family History  Problem Relation Age of Onset  . 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 Wife confirmed history of tobacco abuse. No history of alcohol abuse.  Medications  Current Facility-Administered Medications:  .  0.9 %  sodium chloride infusion, , Intravenous, Continuous, Lorretta Harp, MD, Last Rate: 150 mL/hr at 12/28/16 2310 .  acetaminophen (TYLENOL) tablet 650 mg, 650 mg, Oral, Q6H PRN **OR** acetaminophen (TYLENOL) suppository 650 mg, 650 mg, Rectal, Q6H PRN, Lorretta Harp, MD .  enoxaparin (LOVENOX) injection 40 mg, 40 mg, Subcutaneous, Q24H, Lorretta Harp, MD .  LORazepam (ATIVAN) injection 1 mg, 1 mg, Intravenous, Q2H PRN, Lorretta Harp, MD .  Melene Muller ON 12/29/2016] nicotine (NICODERM CQ - dosed in mg/24 hours)  patch 21 mg, 21 mg, Transdermal, Daily, Lorretta HarpNiu, Xilin, MD .  ondansetron [facility](ZOFRAN) injection 4 mg, 4 mg, Intravenous, Q8H PRN, Lorretta HarpNiu, Xilin, MD No current outpatient prescriptions on file.  Exam: Current vital signs: BP 105/79   Pulse 77   Temp 97.9 F (36.6 C) (Axillary)   Resp 17   SpO2 97%  Vital signs in last 24 hours: Temp:  [97.8 F (36.6 C)-97.9 F (36.6 C)] 97.9 F (36.6 C) (08/08 2058) Pulse Rate:  [73-78] 77 (08/08 2345) Resp:  [12-18] 17 (08/08 2345) BP: (105-126)/(77-91) 105/79  (08/08 2345) SpO2:  [97 %-100 %] 97 % (08/08 2345) Gen. exam: Patient sleeping in bed in no acute distress. Does not wake up to voice. Wakes up to noxious stim. HEENT: Normocephalic, atraumatic, moist oral mucous membranes, no tongue bite Lungs: Clear to auscultation bilaterally with no wheezes Cardiovascular: S1-S2 heard, regular rate rhythm, no murmur or gallop, equal pulses bilaterally Abdomen is soft nontender nondistended with normoactive bowel sounds Extremities are warm well perfused with intact pulses and no pedal edema. Neurological exam Mental status: Sleepy, difficult to arouse, wakes to noxious stimulus and falls back asleep. Follows commands although inconsistently. Speech sounds dysarthric. Unable to cooperate to name or repeat. Cranial nerves: Pupil on the left is pinpoint, barely reactive to light. Right cornea is completely hazy. Did not cooperate with extraocular movement testing but does not seem to have any extraocular movement restriction based on passive maneuvers, no nystagmus noted on my exam, face is symmetric. Motor exam: 5/5 strength in all 4 extremities with normal tone and bulk. Sensory exam: Winces and withdraws equally to noxious stimulus in all 4. Did not cooperate for testing coordination Gait examination was deferred due to mental status 2+ deep tendon reflexes all over with downgoing toes bilaterally.Labs I have reviewed labs in epic and the results pertinent to this consultation are:  CBC    Component Value Date/Time   WBC 5.9 12/28/2016 2052   RBC 4.75 12/28/2016 2052   HGB 15.6 12/28/2016 2110   HCT 46.0 12/28/2016 2110   PLT 169 12/28/2016 2052   MCV 90.1 12/28/2016 2052   MCH 30.1 12/28/2016 2052   MCHC 33.4 12/28/2016 2052   RDW 13.8 12/28/2016 2052   LYMPHSABS 2.3 12/28/2016 2052   MONOABS 0.5 12/28/2016 2052   EOSABS 0.1 12/28/2016 2052   BASOSABS 0.0 12/28/2016 2052    CMP     Component Value Date/Time   NA 144 12/28/2016 2110   K  3.9 12/28/2016 2110   CL 106 12/28/2016 2110   CO2 27 11/11/2016 0032   GLUCOSE 79 12/28/2016 2110   BUN 23 (H) 12/28/2016 2110   CREATININE 1.40 (H) 12/28/2016 2110   CALCIUM 8.5 (L) 11/11/2016 0032   PROT 6.0 (L) 12/28/2016 2052   ALBUMIN 3.6 12/28/2016 2052   AST 30 12/28/2016 2052   ALT 29 12/28/2016 2052   ALKPHOS 77 12/28/2016 2052   BILITOT 1.3 (H) 12/28/2016 2052   GFRNONAA 59 (L) 11/11/2016 0032   GFRAA >60 11/11/2016 0032    Lipid Panel  No results found for: CHOL, TRIG, HDL, CHOLHDL, VLDL, LDLCALC, LDLDIRECT   Imaging I have reviewed the images obtained:  CT-scan of the brain - done today, does not reveal any acute abnormality.  MRI examination of the brain - without contrast, done on 11/11/2016 showed no acute abnormality.  Assessment:  36 year old man with a history of diabetes and tobacco abuse brought in for evaluation of "seizure" activity. He was in  his usual state of health when he went to work this morning and at work he was noted to have 2 seizure-like episodes. A clear description of the episodes at work is not available.  In the emergency room, he had another episode where he had rapid movements of his eyes and was unresponsive at that time, prompting administration of Ativan and Keppra. At the time of my examination, he was very difficult to arouse but had nonfocal exam otherwise. Based on the description of the eye movements, which was reported as nystagmus, along with no body shaking or stiffening as well as no bowel bladder incontinence, I am unsure if this truly was seizure activity. But, he had 2 witnessed episodes at work and another one in the ER making seizures highly likely. The family at bedside refused any illicit drug use but reported excessive caffeine, caffeine supplements and energy drink use. His symptoms could be attributable to stimulant toxicity. Although there is no history, but substance abuse should also be considered as a possible  underlying etiology for his clinical presentation.  Impression: Evaluate for seizures Evaluate for stimulant toxicity/overuse Evaluate for polysubstance abuse Toxic metabolic encephalopathy due to above  Recommendations: Because of the multiplicity of seizure/seizure-like episodes, would agree with continuing Keppra 500 twice a day for now. Would recommend obtaining an MRI with and without contrast as the last imaging modality was an MRI without contrast and was unrevealing. Obtain an EEG in the morning. Maintain seizure precautions Discussed in detail with the family state law prohibiting driving for 6 months after seizures. Also discussed seizure precautions including not operating heavy machinery, no lifting heavy weights, no climbing heights without supervision, no locking outdoors while in the shower etc., no unsupervised bath or swimming.  Neurology will follow with you.  -- Milon Dikes, MD [facility] [phone]  If 7pm to 7am, please call on call as listed on AMION.

## 2016-12-28 NOTE — ED Triage Notes (Signed)
Per EMS, pt from work with c/o new onset seizures x 2 today. No jerking movements of the body. EMS reports rapid eye movements. Pt given 2.5 mg midazolam. Upon arrival pt follows commands.

## 2016-12-28 NOTE — H&P (Signed)
History and Physical    Jeffery SettleHenry Ray ZOX:[id number] DOB: 12-Oct-1980 DOA: 12/28/2016  Referring MD/NP/PA:   PCP: Patient, No Pcp Per   Patient coming from:  The patient is coming from home.  At baseline, pt is independent for most of ADL.  Chief Complaint: seizure-like movement  HPI: Jeffery SettleHenry Ray is a [age] y.o. male with medical history significant of tobacco abuse, diet-controlled diabetes, who presents with seizure-like movement.  Per pt's brother, pt suddenly became unresponsive with rapid eye movement at work at about 8 PM. No extremity jerking movement.  On arrival to the emergency room, pt was again unresponsive with nystagmus and possible facial twitching per EDP. No past history of seizure. Wife states that pt had "heart attack" 6 years ago. His wife reports that he does not use illegal drugs or alcohol. Pt was treated with IV Ativan and loaded with Keppra. Does not follow simple commands. Patient opens eyes to verbal stimulus, but dose not answer any questions. Per his wife, patient had 1 episode of loose stool bowel movement just before going to work at about 6:30 PM. Patient told his wife, he did not feel good early today.  Wife does not think patient has chest pain, SOB or cough. Patient does not have fever or chills. Currently no active nausea, vomiting or diarrhea noted. He moves all extremities normally. No facial droop.  ED Course: pt was found to have WBC 5.9, negative troponin, CK 449, acute renal injury with a creatinine 1.40, temperature normal, no tachycardia, oxygen saturation 98% on room air, negative CT head for acute intracranial abnormalities, pending a UDS. Patient is placed on telemetry bed for observation. Neurology was consulted, Dr. Jerrell BelfastAurora).  Review of Systems: Could not be reviewed since patient does not follow commands and not answer any questions.  Allergy:  Allergies  Allergen Reactions  . Fish Allergy Anaphylaxis  . Shellfish Allergy     Past Medical  History:  Diagnosis Date  . Diabetes mellitus without complication (HCC)   . Tobacco abuse     Past Surgical History:  Procedure Laterality Date  . CORNEAL TRANSPLANT    . HERNIA REPAIR      Social History:  reports that he has been smoking.  He has never used smokeless tobacco. He reports that he does not drink alcohol or use drugs.  Family History:  Family History  Problem Relation Age of Onset  . Diabetes Mellitus II Mother      Prior to Admission medications   Medication Sig Start Date End Date Taking? Authorizing Provider  meclizine (ANTIVERT) 25 MG tablet Take 1 tablet (25 mg total) by mouth 3 (three) times daily as needed for dizziness. 11/11/16   Benjiman CorePickering, Nathan, MD    Physical Exam: Vitals:   12/28/16 2058 12/28/16 2100 12/28/16 2300 12/28/16 2345  BP:  (!) 126/91 105/77 105/79  Pulse:  74 78 77  Resp:  18 18 17   Temp: 97.9 F (36.6 C)     TempSrc: Axillary     SpO2:  100% 98% 97%   General: Not in acute distress HEENT:       Eyes: PERRL, EOMI, no scleral icterus.       ENT: No discharge from the ears and nose, no pharynx injection, no tonsillar enlargement.        Neck: No JVD, no bruit, no mass felt. Heme: No neck lymph node enlargement. Cardiac: S1/S2, RRR, No murmurs, No gallops or rubs. Respiratory: No rales, wheezing, rhonchi or rubs. GI:  Soft, nondistended, nontender, no organomegaly, BS present. GU: No hematuria Ext: No pitting leg edema bilaterally. 2+DP/PT pulse bilaterally. Musculoskeletal: No joint deformities, No joint redness or warmth, no limitation of ROM in spin. Skin: No rashes.  Neuro: pt does not follow command, arousable upon verbal or painful stimuli, not oriented X3, cranial nerves II-XII grossly intact, moves all extremities. Psych: Patient is not psychotic.  Labs on Admission: I have personally reviewed following labs and imaging studies  CBC:  Recent Labs Lab 12/28/16 2052 12/28/16 2110  WBC 5.9  --   NEUTROABS 2.9  --     HGB 14.3 15.6  HCT 42.8 46.0  MCV 90.1  --   PLT 169  --    Basic Metabolic Panel:  Recent Labs Lab 12/28/16 2110  NA 144  K 3.9  CL 106  GLUCOSE 79  BUN 23*  CREATININE 1.40*   GFR: CrCl cannot be calculated (Unknown ideal weight.). Liver Function Tests:  Recent Labs Lab 12/28/16 2052  AST 30  ALT 29  ALKPHOS 77  BILITOT 1.3*  PROT 6.0*  ALBUMIN 3.6   No results for input(s): LIPASE, AMYLASE in the last 168 hours. No results for input(s): AMMONIA in the last 168 hours. Coagulation Profile: No results for input(s): INR, PROTIME in the last 168 hours. Cardiac Enzymes:  Recent Labs Lab 12/28/16 2052  CKTOTAL 449*   BNP (last 3 results) No results for input(s): PROBNP in the last 8760 hours. HbA1C: No results for input(s): HGBA1C in the last 72 hours. CBG: No results for input(s): GLUCAP in the last 168 hours. Lipid Profile: No results for input(s): CHOL, HDL, LDLCALC, TRIG, CHOLHDL, LDLDIRECT in the last 72 hours. Thyroid Function Tests: No results for input(s): TSH, T4TOTAL, FREET4, T3FREE, THYROIDAB in the last 72 hours. Anemia Panel: No results for input(s): VITAMINB12, FOLATE, FERRITIN, TIBC, IRON, RETICCTPCT in the last 72 hours. Urine analysis: No results found for: COLORURINE, APPEARANCEUR, LABSPEC, PHURINE, GLUCOSEU, HGBUR, BILIRUBINUR, KETONESUR, PROTEINUR, UROBILINOGEN, NITRITE, LEUKOCYTESUR Sepsis Labs: @LABRCNTIP (procalcitonin:4,lacticidven:4) )No results found for this or any previous visit (from the past 240 hour(s)).   Radiological Exams on Admission: Ct Head Wo Contrast  Result Date: 12/28/2016 CLINICAL DATA:  Seizure today. EXAM: CT HEAD WITHOUT CONTRAST TECHNIQUE: Contiguous axial images were obtained from the base of the skull through the vertex without intravenous contrast. COMPARISON:  Brain MRI, 11/11/2016 FINDINGS: Brain: No evidence of acute infarction, hemorrhage, hydrocephalus, extra-axial collection or mass lesion/mass effect.  Vascular: No hyperdense vessel or unexpected calcification. Skull: Normal. Negative for fracture or focal lesion. Sinuses/Orbits: Globes and orbits are unremarkable. Sinuses and mastoid air cells are clear. Other: None. IMPRESSION: Normal unenhanced CT scan of the brain. Electronically Signed   By: Amie Portland M.D.   On: 12/28/2016 21:48     EKG: Independently reviewed.  Sinus rhythm, QTC 379, no ischemic change.   Assessment/Plan Principal Problem:   Seizure (HCC) Active Problems:   Tobacco abuse   AKI (acute kidney injury) (HCC)   Rhabdomyolysis   Seizure: pt has seizure-like activity. Etiology is not clear. CT head is negative for acute intracranial abnormalities. Neurology, Dr. Jerrell Belfast was consulted, recommended to continue Keppra, EEG and MRI of the brain with and without contrast.  -will place on tele bed for obs -Frequent neuro check -EEG -Seizure precaution -When necessary Ativan -check UDS -will get MRI of brain with and without contrast -Keppra 500 mg twice a day (patient received 1 g in ED)  AKI: likely due to mild rhabdomyolysis.  CK 449. Creatinine 1.40. - IVF: 500 cc NS, then 150 cc/h - Check FeNa - Follow up renal function by BMP  Mild rhabdomyolysis: -See above  Tobacco abuse: -Nicotine patch   DVT ppx: SQ Lovenox Code Status: Full code Family Communication:   Yes, patient's wife and brother at bed side Disposition Plan:  Anticipate discharge back to previous home environment Consults called:  Neurology was consulted, Dr. Jerrell Belfast  Admission status: Obs / tele   Date of Service 12/28/2016    Lorretta Harp [facility] Pager [phone]  If 7PM-7AM, please contact night-coverage [email or web address] Password [email or web address] 12/28/2016, 11:56 PM

## 2016-12-29 ENCOUNTER — Encounter (HOSPITAL_COMMUNITY): Payer: Self-pay | Admitting: General Practice

## 2016-12-29 ENCOUNTER — Observation Stay (HOSPITAL_COMMUNITY): Payer: Managed Care, Other (non HMO)

## 2016-12-29 DIAGNOSIS — R402132 Coma scale, eyes open, to sound, at arrival to emergency department: Secondary | ICD-10-CM | POA: Diagnosis present

## 2016-12-29 DIAGNOSIS — G92 Toxic encephalopathy: Secondary | ICD-10-CM | POA: Diagnosis present

## 2016-12-29 DIAGNOSIS — F17200 Nicotine dependence, unspecified, uncomplicated: Secondary | ICD-10-CM | POA: Diagnosis present

## 2016-12-29 DIAGNOSIS — Z91013 Allergy to seafood: Secondary | ICD-10-CM | POA: Diagnosis not present

## 2016-12-29 DIAGNOSIS — R569 Unspecified convulsions: Secondary | ICD-10-CM | POA: Diagnosis not present

## 2016-12-29 DIAGNOSIS — M6282 Rhabdomyolysis: Secondary | ICD-10-CM | POA: Diagnosis present

## 2016-12-29 DIAGNOSIS — I252 Old myocardial infarction: Secondary | ICD-10-CM | POA: Diagnosis not present

## 2016-12-29 DIAGNOSIS — Z833 Family history of diabetes mellitus: Secondary | ICD-10-CM | POA: Diagnosis not present

## 2016-12-29 DIAGNOSIS — R42 Dizziness and giddiness: Secondary | ICD-10-CM | POA: Diagnosis present

## 2016-12-29 DIAGNOSIS — H519 Unspecified disorder of binocular movement: Secondary | ICD-10-CM | POA: Diagnosis not present

## 2016-12-29 DIAGNOSIS — G40901 Epilepsy, unspecified, not intractable, with status epilepticus: Secondary | ICD-10-CM | POA: Diagnosis not present

## 2016-12-29 DIAGNOSIS — N182 Chronic kidney disease, stage 2 (mild): Secondary | ICD-10-CM | POA: Diagnosis present

## 2016-12-29 DIAGNOSIS — N289 Disorder of kidney and ureter, unspecified: Secondary | ICD-10-CM | POA: Diagnosis not present

## 2016-12-29 DIAGNOSIS — Z720 Tobacco use: Secondary | ICD-10-CM | POA: Diagnosis not present

## 2016-12-29 DIAGNOSIS — R402212 Coma scale, best verbal response, none, at arrival to emergency department: Secondary | ICD-10-CM | POA: Diagnosis present

## 2016-12-29 DIAGNOSIS — N179 Acute kidney failure, unspecified: Secondary | ICD-10-CM | POA: Diagnosis present

## 2016-12-29 DIAGNOSIS — R402332 Coma scale, best motor response, abnormal, at arrival to emergency department: Secondary | ICD-10-CM | POA: Diagnosis present

## 2016-12-29 DIAGNOSIS — H5462 Unqualified visual loss, left eye, normal vision right eye: Secondary | ICD-10-CM | POA: Diagnosis present

## 2016-12-29 DIAGNOSIS — E1122 Type 2 diabetes mellitus with diabetic chronic kidney disease: Secondary | ICD-10-CM | POA: Diagnosis present

## 2016-12-29 DIAGNOSIS — H5500 Unspecified nystagmus: Secondary | ICD-10-CM | POA: Diagnosis present

## 2016-12-29 LAB — RAPID URINE DRUG SCREEN, HOSP PERFORMED
Amphetamines: NOT DETECTED
BARBITURATES: NOT DETECTED
BENZODIAZEPINES: POSITIVE — AB
Cocaine: NOT DETECTED
Opiates: NOT DETECTED
Tetrahydrocannabinol: NOT DETECTED

## 2016-12-29 LAB — CBC
HEMATOCRIT: 39.7 % (ref 39.0–52.0)
Hemoglobin: 13.6 g/dL (ref 13.0–17.0)
MCH: 30.4 pg (ref 26.0–34.0)
MCHC: 34.3 g/dL (ref 30.0–36.0)
MCV: 88.6 fL (ref 78.0–100.0)
Platelets: 152 10*3/uL (ref 150–400)
RBC: 4.48 MIL/uL (ref 4.22–5.81)
RDW: 13.6 % (ref 11.5–15.5)
WBC: 5.3 10*3/uL (ref 4.0–10.5)

## 2016-12-29 LAB — BASIC METABOLIC PANEL
ANION GAP: 6 (ref 5–15)
BUN: 15 mg/dL (ref 6–20)
CO2: 26 mmol/L (ref 22–32)
Calcium: 8.6 mg/dL — ABNORMAL LOW (ref 8.9–10.3)
Chloride: 111 mmol/L (ref 101–111)
Creatinine, Ser: 1.25 mg/dL — ABNORMAL HIGH (ref 0.61–1.24)
Glucose, Bld: 86 mg/dL (ref 65–99)
POTASSIUM: 3.8 mmol/L (ref 3.5–5.1)
SODIUM: 143 mmol/L (ref 135–145)

## 2016-12-29 LAB — SODIUM, URINE, RANDOM: Sodium, Ur: 183 mmol/L

## 2016-12-29 LAB — HIV ANTIBODY (ROUTINE TESTING W REFLEX): HIV SCREEN 4TH GENERATION: NONREACTIVE

## 2016-12-29 LAB — CREATININE, URINE, RANDOM: CREATININE, URINE: 287.37 mg/dL

## 2016-12-29 LAB — GLUCOSE, CAPILLARY: Glucose-Capillary: 103 mg/dL — ABNORMAL HIGH (ref 65–99)

## 2016-12-29 LAB — CK: CK TOTAL: 306 U/L (ref 49–397)

## 2016-12-29 MED ORDER — GADOBENATE DIMEGLUMINE 529 MG/ML IV SOLN
15.0000 mL | Freq: Once | INTRAVENOUS | Status: AC | PRN
  Administered 2016-12-29: 15 mL via INTRAVENOUS

## 2016-12-29 MED ORDER — SODIUM CHLORIDE 0.9 % IV SOLN
500.0000 mg | Freq: Two times a day (BID) | INTRAVENOUS | Status: DC
  Filled 2016-12-29: qty 5

## 2016-12-29 MED ORDER — SODIUM CHLORIDE 0.9 % IV SOLN
500.0000 mg | Freq: Two times a day (BID) | INTRAVENOUS | Status: DC
  Administered 2016-12-29 – 2016-12-30 (×4): 500 mg via INTRAVENOUS
  Filled 2016-12-29 (×6): qty 5

## 2016-12-29 NOTE — ED Notes (Signed)
Neur Provider bedside

## 2016-12-29 NOTE — ED Notes (Signed)
Patient resting. Family and patient given update still waiting on bed. Patient and family has no complaints at this time.

## 2016-12-29 NOTE — ED Notes (Signed)
CBC clotted per lab needed to be redrawn.

## 2016-12-29 NOTE — ED Notes (Signed)
Lunch at bedside 

## 2016-12-29 NOTE — Progress Notes (Signed)
EEG Completed; Results Pending  

## 2016-12-29 NOTE — ED Notes (Signed)
Order patient Lunch

## 2016-12-29 NOTE — Progress Notes (Signed)
PROGRESS NOTE    Jeffery Ray  [id number] DOB: [birth year] DOA: 12/28/2016 PCP: Patient, No Pcp Per   Outpatient Specialists:     Brief Narrative:  Jeffery Ray is a 36 y.o. male with medical history significant of tobacco abuse, diet-controlled diabetes, who presents with seizure-like movement.  Per pt's brother, pt suddenly became unresponsive with rapid eye movement at work at about 8 PM. No extremity jerking movement.  On arrival to the emergency room, pt was again unresponsive with nystagmus and possible facial twitching per EDP. No past history of seizure. Wife states that pt had "heart attack" 6 years ago. His wife reports that he does not use illegal drugs or alcohol. Pt was treated with IV Ativan and loaded with Keppra.    Assessment & Plan:   Principal Problem:   Seizure (HCC) Active Problems:   Tobacco abuse   AKI (acute kidney injury) (HCC)   Rhabdomyolysis   Seizure:  -Etiology is not clear -CT head is negative for acute intracranial abnormalities -MRI of brain normal -EEG: normal -Seizure precaution -Keppra 500 mg twice a day- may not need long term but will need at least until seen by outpatient neurology  AKI:  -IVF -recheck labs in AM  Tobacco abuse: -Nicotine patch   DVT prophylaxis:  Lovenox   Code Status: Full Code   Family Communication: At bedside  Disposition Plan:     Consultants:   neuro   Subjective: Resting- asking for food  Objective: Vitals:   12/29/16 0545 12/29/16 0630 12/29/16 1000 12/29/16 1030  BP: 113/78 109/76 103/81 112/80  Pulse: 63 71 65 (!) 58  Resp: 15 14 14 16   Temp:      TempSrc:      SpO2: 100% 100% 99% 100%   No intake or output data in the 24 hours ending 12/29/16 1206 There were no vitals filed for this visit.  Examination:  General exam: resting in bed Respiratory system: Clear Cardiovascular system: rrr Gastrointestinal system: +BS, soft Central nervous system: A+Ox3- slow to  respond Skin: No rashes, lesions or ulcers Psychiatry: Judgement and insight appear normal. Mood & affect appropriate.     Data Reviewed: I have personally reviewed following labs and imaging studies  CBC:  Recent Labs Lab 12/28/16 2052 12/28/16 2110 12/29/16 0454  WBC 5.9  --  5.3  NEUTROABS 2.9  --   --   HGB 14.3 15.6 13.6  HCT 42.8 46.0 39.7  MCV 90.1  --  88.6  PLT 169  --  152   Basic Metabolic Panel:  Recent Labs Lab 12/28/16 2110 12/29/16 0313  NA 144 143  K 3.9 3.8  CL 106 111  CO2  --  26  GLUCOSE 79 86  BUN 23* 15  CREATININE 1.40* 1.25*  CALCIUM  --  8.6*   GFR: CrCl cannot be calculated (Unknown ideal weight.). Liver Function Tests:  Recent Labs Lab 12/28/16 2052  AST 30  ALT 29  ALKPHOS 77  BILITOT 1.3*  PROT 6.0*  ALBUMIN 3.6   No results for input(s): LIPASE, AMYLASE in the last 168 hours. No results for input(s): AMMONIA in the last 168 hours. Coagulation Profile: No results for input(s): INR, PROTIME in the last 168 hours. Cardiac Enzymes:  Recent Labs Lab 12/28/16 2052 12/29/16 0313  CKTOTAL 449* 306   BNP (last 3 results) No results for input(s): PROBNP in the last 8760 hours. HbA1C: No results for input(s): HGBA1C in the last 72 hours. CBG: No results for  input(s): GLUCAP in the last 168 hours. Lipid Profile: No results for input(s): CHOL, HDL, LDLCALC, TRIG, CHOLHDL, LDLDIRECT in the last 72 hours. Thyroid Function Tests: No results for input(s): TSH, T4TOTAL, FREET4, T3FREE, THYROIDAB in the last 72 hours. Anemia Panel: No results for input(s): VITAMINB12, FOLATE, FERRITIN, TIBC, IRON, RETICCTPCT in the last 72 hours. Urine analysis: No results found for: COLORURINE, APPEARANCEUR, LABSPEC, PHURINE, GLUCOSEU, HGBUR, BILIRUBINUR, KETONESUR, PROTEINUR, UROBILINOGEN, NITRITE, LEUKOCYTESUR   )No results found for this or any previous visit (from the past 240 hour(s)).    Anti-infectives    None       Radiology  Studies: Ct Head Wo Contrast  Result Date: 12/28/2016 CLINICAL DATA:  Seizure today. EXAM: CT HEAD WITHOUT CONTRAST TECHNIQUE: Contiguous axial images were obtained from the base of the skull through the vertex without intravenous contrast. COMPARISON:  Brain MRI, 11/11/2016 FINDINGS: Brain: No evidence of acute infarction, hemorrhage, hydrocephalus, extra-axial collection or mass lesion/mass effect. Vascular: No hyperdense vessel or unexpected calcification. Skull: Normal. Negative for fracture or focal lesion. Sinuses/Orbits: Globes and orbits are unremarkable. Sinuses and mastoid air cells are clear. Other: None. IMPRESSION: Normal unenhanced CT scan of the brain. Electronically Signed   By: Amie Portland M.D.   On: 12/28/2016 21:48   Mr Laqueta Jean And Wo Contrast  Result Date: 12/29/2016 CLINICAL DATA:  New onset seizure at work.  Nonverbal, unresponsive. EXAM: MRI HEAD WITHOUT AND WITH CONTRAST TECHNIQUE: Multiplanar, multiecho pulse sequences of the brain and surrounding structures were obtained without and with intravenous contrast. CONTRAST:  15mL MULTIHANCE GADOBENATE DIMEGLUMINE 529 MG/ML IV SOLN COMPARISON:  CT HEAD December 28, 2016 and MRI of the head November 11, 2016 FINDINGS: INTRACRANIAL CONTENTS: No reduced diffusion to suggest acute ischemia. No susceptibility artifact to suggest hemorrhage. The ventricles and sulci are normal for patient's age. No suspicious parenchymal signal, masses, mass effect. No abnormal intraparenchymal or extra-axial enhancement. No abnormal extra-axial fluid collections. No extra-axial masses. VASCULAR: Normal major intracranial vascular flow voids present at skull base. SKULL AND UPPER CERVICAL SPINE: No abnormal sellar expansion. No suspicious calvarial bone marrow signal. Craniocervical junction maintained. SINUSES/ORBITS: Trace RIGHT mastoid effusion. Paranasal sinuses are well-aerated. The included ocular globes and orbital contents are non-suspicious. OTHER: None.  IMPRESSION: Normal MRI of the head with and without contrast. Electronically Signed   By: Awilda Metro M.D.   On: 12/29/2016 01:50        Scheduled Meds: . enoxaparin (LOVENOX) injection  40 mg Subcutaneous Daily  . nicotine  21 mg Transdermal Daily   Continuous Infusions: . sodium chloride 150 mL/hr at 12/29/16 0817  . levETIRAcetam Stopped (12/29/16 2130)     LOS: 0 days    Time spent: 25 min    Jocilynn Grade U Kayra Crowell, DO [facility] Pager [phone]  If 7PM-7AM, please contact night-coverage [email or web address] Password [email or web address] 12/29/2016, 12:06 PM

## 2016-12-29 NOTE — ED Notes (Signed)
Admitting Md called spoke with family on the phone.

## 2016-12-29 NOTE — Progress Notes (Addendum)
Subjective: Improved, though apparently still with some mild confusion.   Exam: Vitals:   12/29/16 0545 12/29/16 0630  BP: 113/78 109/76  Pulse: 63 71  Resp: 15 14  Temp:     Gen: In bed, NAD Resp: non-labored breathing, no acute distress Abd: soft, nt  Neuro: MS: awake, alert, oriented to person, place year CN: right eye blind, outwardly deviated. Left eye with full eomi, pupil reactive.  Motor: 5/5 throughout Sensory:intact to LT  Pertinent Labs: BMP - borderline creatinine  Impression: 36 yo M with new onset seizures, given the multiple recurring events, He has been started on keppra, I do not think that he definitely needs long term AED therapy, but I would continue it for now.    Recommendations: 1) Keppra 500mg  BID 2) MRI brain w/wo contrast 3) EEG  4) will follow.   Ritta SlotMcNeill Robert Sunga, MD [facility] [phone]  If 7pm- 7am, please page neurology on call as listed in AMION.

## 2016-12-29 NOTE — Progress Notes (Signed)
Pt arrived onto unit from ED. No c/o pain. A&O x4. Seizure precautions are set up. Telemetry verified. Pt instructed to call for help when needed and verbalizes understanding..Marland Kitchen

## 2016-12-29 NOTE — Procedures (Signed)
ELECTROENCEPHALOGRAM REPORT  Date of Study: 12/29/2016  Patient's Name: Jeffery Ray MRN: [id number] Date of Birth: 1981-01-09  Referring Provider: Dr. Lorretta HarpXilin Niu  Clinical History: This is a 36 year old man with seizure followed by lethargy and confusion.  Medications: levETIRAcetam (KEPPRA) 500 mg in sodium chloride 0.9 % 100 mL IVPB  LORazepam (ATIVAN) injection 1 mg  acetaminophen (TYLENOL) tablet 650 mg  enoxaparin (LOVENOX) injection 40 mg  nicotine (NICODERM CQ - dosed in mg/24 hours) patch 21 mg  ondansetron (ZOFRAN) injection 4 mg   Technical Summary: A multichannel digital EEG recording measured by the international 10-20 system with electrodes applied with paste and impedances below 5000 ohms performed in our laboratory with EKG monitoring in an awake and asleep patient.  Hyperventilation was not performed. Photic stimulation was performed.  The digital EEG was referentially recorded, reformatted, and digitally filtered in a variety of bipolar and referential montages for optimal display.    Description: The patient is awake and asleep during the recording.  There is no clear posterior dominant rhythm. The record is symmetric.  During drowsiness and sleep, there is an increase in theta slowing of the background. Low voltage vertex waves and symmetric sleep spindles were seen.  Photic stimulation did not elicit any abnormalities.  There were no epileptiform discharges or electrographic seizures seen.    EKG lead was unremarkable.  Impression: This awake and asleep EEG is within normal limits.  Clinical Correlation: A normal EEG does not exclude a clinical diagnosis of epilepsy. Clinical correlation is advised.   Jeffery Ray, M.D.

## 2016-12-29 NOTE — Plan of Care (Signed)
Problem: Safety: Goal: Ability to remain free from injury will improve Outcome: Progressing Pt will be free from falls and injuries during this hospitalization.  Problem: Physical Regulation: Goal: Complications related to the disease process, condition or treatment will be avoided or minimized Outcome: Progressing Pt will be free from seizures prior to discharge

## 2016-12-29 NOTE — ED Notes (Signed)
EEG is ready, will send transport in 20 minutes

## 2016-12-29 NOTE — ED Notes (Signed)
[facility] Room 37 Family and Patient requesting a provider come speak and discuss EEG results during next rounds.   Jeffery MorrisonJ. Con Arganbright RN

## 2016-12-29 NOTE — ED Notes (Signed)
Pt in MRI.

## 2016-12-29 NOTE — ED Notes (Signed)
Patient off the floor.

## 2016-12-29 NOTE — ED Notes (Signed)
Patient transported to MRI 

## 2016-12-30 ENCOUNTER — Inpatient Hospital Stay (HOSPITAL_COMMUNITY): Payer: Managed Care, Other (non HMO)

## 2016-12-30 DIAGNOSIS — N179 Acute kidney failure, unspecified: Secondary | ICD-10-CM

## 2016-12-30 DIAGNOSIS — Z720 Tobacco use: Secondary | ICD-10-CM

## 2016-12-30 DIAGNOSIS — M6282 Rhabdomyolysis: Secondary | ICD-10-CM

## 2016-12-30 DIAGNOSIS — N289 Disorder of kidney and ureter, unspecified: Secondary | ICD-10-CM

## 2016-12-30 LAB — BASIC METABOLIC PANEL
Anion gap: 6 (ref 5–15)
BUN: 15 mg/dL (ref 6–20)
CALCIUM: 8.3 mg/dL — AB (ref 8.9–10.3)
CHLORIDE: 107 mmol/L (ref 101–111)
CO2: 27 mmol/L (ref 22–32)
CREATININE: 1.37 mg/dL — AB (ref 0.61–1.24)
GFR calc non Af Amer: >60 mL/min (ref >60)
GLUCOSE: 109 mg/dL — AB (ref 65–99)
Potassium: 3.5 mmol/L (ref 3.5–5.1)
Sodium: 140 mmol/L (ref 135–145)

## 2016-12-30 LAB — CBC
HEMATOCRIT: 39.6 % (ref 39.0–52.0)
HEMOGLOBIN: 13.3 g/dL (ref 13.0–17.0)
MCH: 30.2 pg (ref 26.0–34.0)
MCHC: 33.6 g/dL (ref 30.0–36.0)
MCV: 90 fL (ref 78.0–100.0)
Platelets: 147 10*3/uL — ABNORMAL LOW (ref 150–400)
RBC: 4.4 MIL/uL (ref 4.22–5.81)
RDW: 13.6 % (ref 11.5–15.5)
WBC: 6.3 10*3/uL (ref 4.0–10.5)

## 2016-12-30 LAB — GLUCOSE, CAPILLARY: Glucose-Capillary: 89 mg/dL (ref 65–99)

## 2016-12-30 MED ORDER — DIPHENHYDRAMINE HCL 50 MG/ML IJ SOLN
25.0000 mg | Freq: Once | INTRAMUSCULAR | Status: AC
  Administered 2016-12-30: 25 mg via INTRAVENOUS
  Filled 2016-12-30: qty 1

## 2016-12-30 MED ORDER — METOCLOPRAMIDE HCL 5 MG/ML IJ SOLN
10.0000 mg | Freq: Once | INTRAMUSCULAR | Status: AC
  Administered 2016-12-30: 10 mg via INTRAVENOUS
  Filled 2016-12-30: qty 2

## 2016-12-30 NOTE — Progress Notes (Signed)
RN notified by family wife that patient appears to be having another "episode". RN arrived to patient's room to find patient in bed with eyes closed not responding to any questions. Family member stated patient did not have any jerking movement of body, patient's voice just went to a whisper and his eyes closed and began having slight L eye movement back and forth. VSS. When patient became more responsive (after approximately 2-3 minutes) he stated he was dizzy. MD notified of episode. Will continue to monitor.

## 2016-12-30 NOTE — Progress Notes (Signed)
Pt without PCP, [place]. CM provided pt with [facility] information to assist in locating a PCP. Pt stated will share information with wife and states wife [place]. Gae GallopAngela Zakiah Beckerman RN,BSN,CM

## 2016-12-30 NOTE — Progress Notes (Signed)
PROGRESS NOTE    Jeffery Ray  IRJ:[id number] DOB: 04/02/1981 DOA: 12/28/2016 PCP: Patient, No Pcp Per   Chief Complaint  Patient presents with  . Seizures    Brief Narrative:  HPI on 12/28/2016 by Dr. Lorretta Harp Jeffery Ray is a 36 y.o. male with medical history significant of tobacco abuse, diet-controlled diabetes, who presents with seizure-like movement. Per pt's brother, pt suddenly became unresponsive with rapid eye movement at work at about 8 PM. No extremity jerking movement.  On arrival to the emergency room, pt was again unresponsive with nystagmus and possible facial twitching per EDP. No past history of seizure. Wife states that pt had "heart attack" 6 years ago. His wife reports that he does not use illegal drugs or alcohol. Pt was treated with IV Ativan and loaded with Keppra. Does not follow simple commands. Patient opens eyes to verbal stimulus, but dose not answer any questions. Per his wife, patient had 1 episode of loose stool bowel movement just before going to work at about 6:30 PM. Patient told his wife, he did not feel good early today.  Wife does not think patient has chest pain, SOB or cough. Patient does not have fever or chills. Currently no active nausea, vomiting or diarrhea noted. He moves all extremities normally. No facial droop. Assessment & Plan   Seizure -Unknown etiology -No chest x-ray or UA done on admission to rule out infectious etiology. However patient afebrile with no leukocytosis. No complaints of cough or shortness of breath or changes with urinary habits. -UDS positive for benzodiazepines -CT head negative for acute intracranial maladies -MRI brain: Normal -EEG unremarkable -Continue seizure precautions -Continue Keppra, 500 mg twice daily -Neurology consultation appreciated -Overnight, patient had some abnormal eye movement, likely nystagmus -Discussed this with Dr. Amada Jupiter, neurology, who recommended continuous EEG monitoring  Acute  kidney injury versus chronic kidney disease -Creatinine upon admission 1.40.  -Unknown baseline. 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 Continue continuous EEG  Consultants Neurology   Procedures  EEG  Antibiotics   Anti-infectives    None      Subjective:   [name] seen and examined today.  Patient feeling better today. Denies dizziness or headache. Denies chest pain, shortness of breath, abdominal pain, N/V/D/C.     Objective:   Vitals:   12/29/16 2335 12/30/16 0553 12/30/16 0555 12/30/16 1328  BP: 124/78  122/82 115/77  Pulse: 65  64 76  Resp:   18 18  Temp:   97.7 F (36.5 C) 97.9 F (36.6 C)  TempSrc:   Oral Oral  SpO2:   100% 100%  Weight:  85.7 kg (188 lb 15 oz)    Height:        Intake/Output Summary (Last 24 hours) at 12/30/16 1526 Last data filed at 12/30/16 1500  Gross per 24 hour  Intake             2512 ml  Output                0 ml  Net             2512 ml   Filed Weights   12/29/16 1612 12/30/16 0553  Weight: 86.2 kg (190 lb) 85.7 kg (188 lb 15 oz)    Exam  General: Well developed, well nourished, NAD, appears stated age  HEENT: NCAT, mucous membranes moist.   Cardiovascular: S1 S2 auscultated, RRR, no murmurs  Respiratory: Clear to auscultation bilaterally with equal chest rise  Abdomen: Soft, nontender, nondistended, + bowel sounds  Extremities: warm dry without cyanosis clubbing or edema  Neuro: AAOx3, nonfocal (right eye  blindness)  Psych: Normal affect and demeanor, appreciate  Data Reviewed: I have personally reviewed following labs and imaging studies  CBC:  Recent Labs Lab 12/28/16 2052 12/28/16 2110 12/29/16 0454 12/30/16 0401  WBC 5.9  --  5.3 6.3  NEUTROABS 2.9  --   --   --   HGB 14.3 15.6 13.6 13.3  HCT 42.8 46.0 39.7 39.6  MCV 90.1  --  88.6 90.0  PLT 169  --  152 147*   Basic Metabolic Panel:  Recent Labs Lab 12/28/16 2110 12/29/16 0313 12/30/16 0401  NA 144 143 140  K 3.9 3.8 3.5  CL 106 111 107  CO2  --  26 27  GLUCOSE 79 86 109*  BUN 23* 15 15  CREATININE 1.40* 1.25* 1.37*  CALCIUM  --  8.6* 8.3*   GFR: Estimated Creatinine Clearance: 81 mL/min (A) (by C-G formula based on SCr of 1.37 mg/dL (H)). Liver Function Tests:  Recent Labs Lab 12/28/16 2052  AST 30  ALT 29  ALKPHOS 77  BILITOT 1.3*  PROT 6.0*  ALBUMIN 3.6   No results for input(s): LIPASE, AMYLASE in the last 168 hours. No results for input(s): AMMONIA in the last 168 hours. Coagulation Profile: No results for input(s): INR, PROTIME in the last 168 hours. Cardiac Enzymes:  Recent Labs Lab 12/28/16 2052 12/29/16 0313  CKTOTAL 449* 306   BNP (last 3 results) No results for input(s): PROBNP in the last 8760 hours. HbA1C: No results for input(s): HGBA1C in the last 72 hours. CBG:  Recent Labs Lab 12/29/16 2336 12/30/16 0849  GLUCAP 103* 89   Lipid Profile: No results for input(s): CHOL, HDL, LDLCALC, TRIG, CHOLHDL, LDLDIRECT in the last 72 hours. Thyroid Function Tests: No results for input(s): TSH, T4TOTAL, FREET4, T3FREE, THYROIDAB in the last 72 hours. Anemia Panel: No results for input(s): VITAMINB12, FOLATE, FERRITIN, TIBC, IRON, RETICCTPCT in the last 72 hours. Urine analysis: No results found for: COLORURINE, APPEARANCEUR, LABSPEC, PHURINE, GLUCOSEU, HGBUR, BILIRUBINUR, KETONESUR, PROTEINUR, UROBILINOGEN, NITRITE, LEUKOCYTESUR Sepsis  Labs: @LABRCNTIP (procalcitonin:4,lacticidven:4)  )No results found for this or any previous visit (from the past 240 hour(s)).    Radiology Studies: Ct Head Wo Contrast  Result Date: 12/28/2016 CLINICAL DATA:  Seizure today. EXAM: CT HEAD WITHOUT CONTRAST TECHNIQUE: Contiguous axial images were obtained from the base of the skull through the vertex without intravenous contrast. COMPARISON:  Brain MRI, 11/11/2016 FINDINGS: Brain: No evidence of acute infarction, hemorrhage, hydrocephalus, extra-axial collection or mass lesion/mass effect. Vascular: No hyperdense vessel or unexpected calcification. Skull: Normal. Negative for fracture or focal lesion. Sinuses/Orbits: Globes and orbits are unremarkable. Sinuses and mastoid air cells are clear. Other: None. IMPRESSION: Normal unenhanced CT scan of the brain. Electronically Signed   By: Amie Portland M.D.   On: 12/28/2016 21:48   Mr Laqueta Jean And Wo Contrast  Result Date: 12/29/2016 CLINICAL DATA:  New onset seizure at work.  Nonverbal, unresponsive. EXAM: MRI HEAD WITHOUT AND WITH CONTRAST TECHNIQUE: Multiplanar, multiecho pulse sequences of the brain and surrounding structures were obtained without and with intravenous contrast. CONTRAST:  15mL MULTIHANCE GADOBENATE DIMEGLUMINE 529 MG/ML IV SOLN COMPARISON:  CT HEAD December 28, 2016 and MRI of the head November 11, 2016 FINDINGS: INTRACRANIAL CONTENTS: No reduced diffusion to suggest acute ischemia. No susceptibility artifact to suggest hemorrhage. The ventricles and sulci are normal for patient's age. No suspicious parenchymal signal, masses, mass effect. No abnormal intraparenchymal or extra-axial enhancement. No abnormal extra-axial fluid collections. No extra-axial masses. VASCULAR: Normal major intracranial vascular flow voids present at skull base. SKULL AND UPPER CERVICAL SPINE: No abnormal sellar expansion. No suspicious calvarial bone marrow signal. Craniocervical junction maintained. SINUSES/ORBITS: Trace  RIGHT mastoid effusion. Paranasal sinuses are well-aerated. The included ocular globes and orbital contents are non-suspicious. OTHER: None. IMPRESSION: Normal MRI of the head with and without contrast. Electronically Signed   By: Awilda Metroourtnay  Bloomer M.D.   On: 12/29/2016 01:50     Scheduled Meds: . enoxaparin (LOVENOX) injection  40 mg Subcutaneous Daily  . nicotine  21 mg Transdermal Daily   Continuous Infusions: . sodium chloride 75 mL/hr at 12/30/16 1333  . levETIRAcetam Stopped (12/30/16 1017)     LOS: 1 day   Time Spent in minutes   30 minutes  Mikena Masoner D.O. on 12/30/2016 at 3:26 PM  Between 7am to 7pm - Pager - [phone]  After 7pm go to [email or web address] - password [email or web address]  And look for the night coverage person covering for me after hours  [facility] Office  [phone]

## 2016-12-30 NOTE — Progress Notes (Signed)
vLTM EEG running. Tested event button. Educated nurse and to pass along info to third shift. Notified neuro

## 2016-12-30 NOTE — Progress Notes (Signed)
Subjective: Laying in bed comfortably this morning. No seizure events overnight. Tolerating Keppra well. Endorses some blurry vision out of the left eye and transient dizziness, particularly when he moves his head.  Current Pertinent Medications: Keppra 500mg  BID  Pertinent Labs/Diagnostics: EEG negative MR brain negative CT head negative  Physical Examination: Vitals:   12/29/16 2335 12/30/16 0555  BP: 124/78 122/82  Pulse: 65 64  Resp:  18  Temp:  97.7 F (36.5 C)  SpO2:  100%    General: WDWN male.  HEENT:  Normocephalic, no lesions, without obvious abnormality.  Normal external eye and conjunctiva.  Normal external ears. Normal external nose, mucus membranes and septum.  Normal pharynx. Pulmonary: Unlabored breathing Abdomen: Soft, non-tender Extremities: no joint deformities, effusion, or inflammation Musculoskeletal: Tone and bulk normal throughout; no atrophy noted  Neurological Examination:  CN: Blind in the right eye, outwardly deviated. Left pupil round and reactive. EOMI without nystagmus in the left. Facial sensation is intact to light touch. Face is symmetric at rest with normal strength and mobility. Hearing is intact to conversational voice. Palate elevates symmetrically and uvula is midline. Voice is normal in tone, pitch and quality. Tongue is midline with normal bulk and mobility.  Motor: Normal bulk, tone, and strength. 5/5 throughout. No drift.  Sensation: Intact to light touch.  DTRs: 2+ symmetric;  Toes downgoing bilaterally. No pathologic reflexes.  Coordination: Finger-to-nose without dysmetria    Assessment and recommendations per attending neurologist.  Bruna PotterJamie Aldridge PA-C [facility] [phone]  12/30/2016, 10:49 AM  I have seen the patient and reviewed the above note.  Assessment: 36 year old male with recurrent episodes of unclear etiology. There are some aspects under that are concerning for seizure, but I would like to further  characterize them and therefore I will contact him for continuous EEG.  Recommendations: 1) continue Keppra 500 mg twice a day 2) overnight EEG   [place] [facility] [place], patients with seizures are not allowed to drive until they have been seizure-free for six months. Use caution when using heavy equipment or power tools. Avoid working on ladders or at heights. Take showers instead of baths. Ensure the water temperature is not too high on the home water heater. Do not go swimming alone. Do not lock yourself in a room alone (i.e. bathroom). When caring for infants or small children, sit down when holding, feeding, or changing them to minimize risk of injury to the child in the event you have a seizure. Maintain good sleep hygiene. Avoid alcohol.   If Weston SettleHenry Villarruel has another seizure, call 911 and bring them back to the ED if:       A.  The seizure lasts longer than 5 minutes.            B.  The patient doesn't wake shortly after the seizure or has new problems such as difficulty seeing, speaking or moving following the seizure       C.  The patient was injured during the seizure       D.  The patient has a temperature over 102 F (39C)       E.  The patient vomited during the seizure and now is having trouble breathing

## 2016-12-31 ENCOUNTER — Inpatient Hospital Stay (HOSPITAL_COMMUNITY): Payer: Managed Care, Other (non HMO)

## 2016-12-31 LAB — CBC
HCT: 41.3 % (ref 39.0–52.0)
Hemoglobin: 14 g/dL (ref 13.0–17.0)
MCH: 29.8 pg (ref 26.0–34.0)
MCHC: 33.9 g/dL (ref 30.0–36.0)
MCV: 87.9 fL (ref 78.0–100.0)
Platelets: 160 10*3/uL (ref 150–400)
RBC: 4.7 MIL/uL (ref 4.22–5.81)
RDW: 12.8 % (ref 11.5–15.5)
WBC: 5.8 10*3/uL (ref 4.0–10.5)

## 2016-12-31 LAB — BASIC METABOLIC PANEL
Anion gap: 8 (ref 5–15)
BUN: 13 mg/dL (ref 6–20)
CALCIUM: 8.4 mg/dL — AB (ref 8.9–10.3)
CO2: 25 mmol/L (ref 22–32)
CREATININE: 1.26 mg/dL — AB (ref 0.61–1.24)
Chloride: 107 mmol/L (ref 101–111)
GFR calc non Af Amer: >60 mL/min (ref >60)
Glucose, Bld: 92 mg/dL (ref 65–99)
Potassium: 3.5 mmol/L (ref 3.5–5.1)
SODIUM: 140 mmol/L (ref 135–145)

## 2016-12-31 LAB — GLUCOSE, CAPILLARY: GLUCOSE-CAPILLARY: 137 mg/dL — AB (ref 65–99)

## 2016-12-31 MED ORDER — IOPAMIDOL (ISOVUE-370) INJECTION 76%
INTRAVENOUS | Status: AC
  Administered 2016-12-31: 50 mL via INTRAVENOUS
  Filled 2016-12-31: qty 50

## 2016-12-31 NOTE — Progress Notes (Signed)
Pt c/o headache unrelieved by tylenol. Provider on call text paged to notify. [facility] provider. Olene CravenJackson, Demon Volante Makika, RN

## 2016-12-31 NOTE — Procedures (Signed)
  Electroencephalogram report- LTM    Data acquisition: 10-20 electrode placement.  Additional T1, T2, and EKG electrodes; 26 channel digital referential acquisition reformatted to 18 channel/7 channel coronal bipolar     Spike detection: ON     Seizure detection: ON   Beginning time: 12/30/16 at 16 34 Ending time: 12/31/16 at 08 34 am   Day of study: day 1    This 15  hours of intensive EEG monitoring with simultaneous video monitoring was performed for this patient with spells of dizziness  as a part of ongoing series to capture events of interest and determine if these are seizures.    Medications: as per emr  There was several  pushbutton activations events during this recording activated due to spells of dizziness. EEG remained normal waking bgr activities prior during and after these events.   Waking background activities were marked by attenuated 10 cps posterior dominant symmetric synchronized alpha rhythm which tends to attenuate with eyes opening.  Patient had normal drowsiness and sleep architecture.  There was no epileptiform discharges clinical or subclinical seizures present.    Clinical interpretation: This 15  hours of intensive EEG monitoring with simultaneous monitoring did not record any clinical or subclinical seizures. Spells of dizziness were not accompanied by any eeg changes to suggest seizures.  Background activities were normal during wakefulness drowsiness and sleep throughout the recording.

## 2016-12-31 NOTE — Progress Notes (Signed)
PROGRESS NOTE    Jeffery Ray  KGM:[id number] DOB: [birth year] DOA: 12/28/2016 PCP: Patient, No Pcp Per   Chief Complaint  Patient presents with  . Seizures    Brief Narrative:  HPI on 12/28/2016 by Dr. Lorretta HarpXilin Niu Jeffery SettleHenry Ray is a 36 y.o. male with medical history significant of tobacco abuse, diet-controlled diabetes, who presents with seizure-like movement. Per pt's brother, pt suddenly became unresponsive with rapid eye movement at work at about 8 PM. No extremity jerking movement.  On arrival to the emergency room, pt was again unresponsive with nystagmus and possible facial twitching per EDP. No past history of seizure. Wife states that pt had "heart attack" 6 years ago. His wife reports that he does not use illegal drugs or alcohol. Pt was treated with IV Ativan and loaded with Keppra. Does not follow simple commands. Patient opens eyes to verbal stimulus, but dose not answer any questions. Per his wife, patient had 1 episode of loose stool bowel movement just before going to work at about 6:30 PM. Patient told his wife, he did not feel good early today.  Wife does not think patient has chest pain, SOB or cough. Patient does not have fever or chills. Currently no active nausea, vomiting or diarrhea noted. He moves all extremities normally. No facial droop. Assessment & Plan   Seizure -Unknown etiology -No chest x-Ray or UA done on admission to rule out infectious etiology. However patient afebrile with no leukocytosis. No complaints of cough or shortness of breath or changes with urinary habits. -UDS positive for benzodiazepines -CT head negative for acute intracranial maladies -MRI brain: Normal -EEG unremarkable -Continue seizure precautions -Neurology consultation appreciated -Overnight on 8/9-10, patient had some abnormal eye movement, likely nystagmus -Continuous EEG monitoring showed no epileptiform discharges or subclinical seizures -Discussed with Dr. Amada JupiterKirkpatrick, may  discontinue keppra.  ?whether patient is having true seizures vs psych related episodes as patient was very lethargic, dizzy upon my examination this morning. Patient may need vestibular rehab  Acute kidney injury versus chronic kidney disease -Creatinine upon admission 1.40.  -Unknown baseline. 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 Pending further recommendations from neurology.  Consultants Neurology   Procedures  EEG  Antibiotics   Anti-infectives    None      Subjective:   Jeffery Ray seen and examined today.  Was lethargic upon my examination. Patient complained of headache and dizziness.   Objective:   Vitals:   12/30/16 0555 12/30/16 1328 12/30/16 2033 12/31/16 0544  BP: 122/82 115/77 114/81 119/79  Pulse: 64 76 67 (!) 50  Resp: 18 18 18 18   Temp: 97.7 F (36.5 C) 97.9 F (36.6 C) (!) 97.5 F (36.4 C) 97.6 F (36.4 C)  TempSrc: Oral Oral Oral Oral  SpO2: 100% 100% 99% 100%  Weight:      Height:        Intake/Output Summary (Last 24 hours) at 12/31/16 1322 Last data filed at 12/31/16 0900  Gross per 24 hour  Intake  1565.25 ml  Output             2250 ml  Net          -684.75 ml   Filed Weights   12/29/16 1612 12/30/16 0553  Weight: 86.2 kg (190 lb) 85.7 kg (188 lb 15 oz)   Exam  General: Well developed, well nourished, NAD  HEENT: NCAT, mucous membranes moist. Right eye blindness  Cardiovascular: S1 S2 auscultated, RRR, no  murmurs  Respiratory: Clear to auscultation bilaterally  Abdomen: Soft, nontender, nondistended, + bowel sounds  Extremities: warm dry without cyanosis clubbing or edema  Neuro: awake, could not really assess orientation as patient was lethargic. Was moving all extremities. Left eye movement, vertically and rolled back- no nystagmus  Data Reviewed: I have personally reviewed following labs and imaging studies  CBC:  Recent Labs Lab 12/28/16 2052 12/28/16 2110 12/29/16 0454 12/30/16 0401 12/31/16 0453  WBC 5.9  --  5.3 6.3 5.8  NEUTROABS 2.9  --   --   --   --   HGB 14.3 15.6 13.6 13.3 14.0  HCT 42.8 46.0 39.7 39.6 41.3  MCV 90.1  --  88.6 90.0 87.9  PLT 169  --  152 147* 160   Basic Metabolic Panel:  Recent Labs Lab 12/28/16 2110 12/29/16 0313 12/30/16 0401 12/31/16 0453  NA 144 143 140 140  K 3.9 3.8 3.5 3.5  CL 106 111 107 107  CO2  --  26 27 25   GLUCOSE 79 86 109* 92  BUN 23* 15 15 13   CREATININE 1.40* 1.25* 1.37* 1.26*  CALCIUM  --  8.6* 8.3* 8.4*   GFR: Estimated Creatinine Clearance: 88.1 mL/min (A) (by C-G formula based on SCr of 1.26 mg/dL (H)). Liver Function Tests:  Recent Labs Lab 12/28/16 2052  AST 30  ALT 29  ALKPHOS 77  BILITOT 1.3*  PROT 6.0*  ALBUMIN 3.6   No results for input(s): LIPASE, AMYLASE in the last 168 hours. No results for input(s): AMMONIA in the last 168 hours. Coagulation Profile: No results for input(s): INR, PROTIME in the last 168 hours. Cardiac Enzymes:  Recent Labs Lab 12/28/16 2052 12/29/16 0313  CKTOTAL 449* 306   BNP (last 3 results) No results for input(s): PROBNP in the last 8760 hours. HbA1C: No results for input(s): HGBA1C in the last 72 hours. CBG:  Recent Labs Lab 12/29/16 2336 12/30/16 0849 12/31/16 0856  GLUCAP 103* 89 137*   Lipid Profile: No results for input(s): CHOL, HDL, LDLCALC, TRIG, CHOLHDL, LDLDIRECT in the last 72 hours. Thyroid Function Tests: No results for input(s): TSH,  T4TOTAL, FREET4, T3FREE, THYROIDAB in the last 72 hours. Anemia Panel: No results for input(s): VITAMINB12, FOLATE, FERRITIN, TIBC, IRON, RETICCTPCT in the last 72 hours. Urine analysis: No results found for: COLORURINE, APPEARANCEUR, LABSPEC, PHURINE, GLUCOSEU, HGBUR, BILIRUBINUR, KETONESUR, PROTEINUR, UROBILINOGEN, NITRITE, LEUKOCYTESUR Sepsis Labs: @LABRCNTIP (procalcitonin:4,lacticidven:4)  )No results found for this or any previous visit (from the past 240 hour(s)).    Radiology Studies: No results found.   Scheduled Meds: . enoxaparin (LOVENOX) injection  40 mg Subcutaneous Daily  . nicotine  21 mg Transdermal Daily   Continuous Infusions: . sodium chloride 75 mL/hr at 12/31/16 0412     LOS: 2 days   Time Spent in minutes   30 minutes  Aundray Cartlidge D.O. on 12/31/2016 at 1:22 PM  Between 7am to 7pm - Pager - [phone]  After 7pm go to [email or web address] - password [email or web address]  And look for  the night coverage person covering for me after hours  [facility] Office  [phone]

## 2016-12-31 NOTE — Progress Notes (Signed)
vLTM EEG complete. No skin breakdown noted 

## 2016-12-31 NOTE — Progress Notes (Signed)
Subjective: Sleeping and hooked up to cEEG on my arrival this morning. [name] states that he has only a slight headache but feels "much better" than he did yesterday. Minimal dizziness that is exaggerated with lateral EOM endgaze testing. States that he has no recollection of the rapid eye movement event that occurred yesterday (per his wife), but recognizes feeling "drained" following them. Tolerating Keppra well. No questions or complaints about cEEG.  Current Pertinent Medications: 500mg  Keppra BID  Pertinent Labs/Diagnostics: EEG negative MR brain negative CT head negative Continuous EEG pending  Physical Examination: Vitals:   12/30/16 2033 12/31/16 0544  BP: 114/81 119/79  Pulse: 67 (!) 50  Resp: 18 18  Temp: (!) 97.5 F (36.4 C) 97.6 F (36.4 C)  SpO2: 99% 100%    General: WDWN male.  HEENT:  Normocephalic, no lesions, without obvious abnormality.  Normal external eye and conjunctiva.  Normal external ears. Normal external nose, mucus membranes and septum.  Normal pharynx. Pulmonary: Unlabored breathing Abdomen: Soft Extremities: no joint deformities Musculoskeletal: Tone and bulk normal throughout; no atrophy noted  Neurological Examination: ZO:[id number]:Blind in the right eye, outwardly deviated. Left pupil round and reactive. EOMI with end-gaze nystagmus bilaterally, more on the left. Facial sensation is intact to light touch. Face is symmetric at rest with normal strength and mobility. Hearing is intact to conversational voice. Palate elevates symmetrically and uvula is midline. Voice is normal in tone, pitch and quality. Tongue is midline with normal bulk and mobility.  Motor:Normal bulk, tone, and strength. 5/5 throughout.No drift.  Sensation: Intact to light touch.  DTRs:2+ symmetric;  Toes downgoing bilaterally. Coordination: Finger-to-nose without dysmetria   Assessment and recommendations per attending neurologist.  Bruna PotterJamie Aldridge PA-C [facility] [phone]  12/31/2016, 7:23 AM  I have seen the patient reviewed the above note  Assessment: 36 year old male with intermittent dizziness and "rapid eye movements." He had several episodes which are typical for him captured on EEG, without epileptic correlate.  Recommendations: 1) discontinue Keppra 2) CT angiogram for vertebrobasilar insufficiency 3) if this is negative, then I would favor vestibular rehabilitation  Ritta SlotMcNeill Olia Hinderliter, MD [facility] [phone]  If 7pm- 7am, please page neurology on call as listed in AMION.

## 2017-01-01 DIAGNOSIS — H519 Unspecified disorder of binocular movement: Secondary | ICD-10-CM

## 2017-01-01 DIAGNOSIS — G40901 Epilepsy, unspecified, not intractable, with status epilepticus: Secondary | ICD-10-CM

## 2017-01-01 LAB — GLUCOSE, CAPILLARY: Glucose-Capillary: 74 mg/dL (ref 65–99)

## 2017-01-01 LAB — BASIC METABOLIC PANEL
Anion gap: 5 (ref 5–15)
BUN: 11 mg/dL (ref 6–20)
CHLORIDE: 107 mmol/L (ref 101–111)
CO2: 28 mmol/L (ref 22–32)
Calcium: 8.5 mg/dL — ABNORMAL LOW (ref 8.9–10.3)
Creatinine, Ser: 1.27 mg/dL — ABNORMAL HIGH (ref 0.61–1.24)
GFR calc Af Amer: >60 mL/min (ref >60)
GFR calc non Af Amer: >60 mL/min (ref >60)
GLUCOSE: 103 mg/dL — AB (ref 65–99)
POTASSIUM: 3.7 mmol/L (ref 3.5–5.1)
Sodium: 140 mmol/L (ref 135–145)

## 2017-01-01 MED ORDER — NICOTINE 21 MG/24HR TD PT24: 21.0000 mg | MEDICATED_PATCH | Freq: Every day | TRANSDERMAL | 0 refills | Status: AC

## 2017-01-01 NOTE — Progress Notes (Signed)
Patient refuses to put tele back on. Pt educated and still refused.

## 2017-01-01 NOTE — Discharge Instructions (Signed)

## 2017-01-01 NOTE — Evaluation (Signed)
Physical Therapy Evaluation Patient Details Name: Jeffery Ray MRN: [id number] DOB: [birth year] Today's Date: 01/01/2017   History of Present Illness  Jeffery Ray is a 36 y.o. male with medical history significant of tobacco abuse, diet-controlled diabetes, who presents with seizure-like movement and unresponsiveness.  Clinical Impression  Patient is functioning at independent level with mobility and gait.  Patient with c/o dizziness that appears to be primarily lightheadedness/faint feeling.  Patient did have this faint feeling with Dix-Hallpike to both sides, but no nystagmus.  Patient also with motion sensitivity symptoms.  Recommend OP PT for continued Vestibular Rehab testing and treatment.  Patient safe with mobility, so safe to d/c home from PT perspective.    Follow Up Recommendations Supervision - Intermittent;Outpatient PT (for Vestibular Rehab)    Equipment Recommendations  None recommended by PT    Recommendations for Other Services       Precautions / Restrictions Precautions Precautions: None Precaution Comments: dizzy Restrictions Weight Bearing Restrictions: No      Mobility  Bed Mobility Overal bed mobility: Independent                Transfers Overall transfer level: Independent Equipment used: None                Ambulation/Gait Ambulation/Gait assistance: Independent Ambulation Distance (Feet): 175 Feet Assistive device: None Gait Pattern/deviations: WFL(Within Functional Limits)   Gait velocity interpretation: at or above normal speed for age/gender General Gait Details: Patient with good gait pattern, balance, and speed  Stairs            Wheelchair Mobility    Modified Rankin (Stroke Patients Only)       Balance Overall balance assessment: Independent                           High level balance activites: Backward walking;Direction changes;Turns;Sudden stops;Head turns (Stepping over obstacles) High Level  Balance Comments: No loss of balance with high level balance activities             Pertinent Vitals/Pain Pain Assessment: No/denies pain    Home Living Family/patient expects to be discharged to:: Private residence Living Arrangements: Spouse/significant other;Other relatives (Mother-in-law) Available Help at Discharge: Family;Available 24 hours/day Type of Home: Apartment (Condo) Home Access: Level entry     Home Layout: One level Home Equipment: Shower seat      Prior Function Level of Independence: Independent         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, newspaper        Extremity/Trunk Assessment   Upper Extremity Assessment Upper Extremity Assessment: Overall WFL for tasks assessed    Lower Extremity Assessment Lower Extremity Assessment: Overall WFL for tasks assessed    Cervical / Trunk Assessment Cervical / Trunk Assessment: Normal  Communication   Communication: No difficulties  Cognition Arousal/Alertness: Awake/alert Behavior During Therapy: WFL for tasks assessed/performed Overall Cognitive Status: Within Functional Limits for tasks assessed                                        General Comments      Exercises Other Exercises Other Exercises: *Oculomotor:  Blind Rt eye.  Smooth pursuits and saccades normal.  No nystagmus noted at end ranges.  Head shaking with no nystagmus. Other Exercises: *Supine roll test - Negative to both sides  Other Exercises: *Dix-Hallpike:  Patient reports feeling of lightheadedness to both sides.  No nystagmus noted. Other Exercises: *Patient describes dizziness as lightheadedness, off balance, and sometimes spinning.   Assessment/Plan    PT Assessment All further PT needs can be met in the next venue of care  PT Problem List Decreased knowledge of precautions (Dizziness)       PT Treatment Interventions      PT Goals (Current goals can be found in the Care Plan section)   Acute Rehab PT Goals Patient Stated Goal: To find out what is wrong PT Goal Formulation: All assessment and education complete, DC therapy (To f/u with OP PT)    Frequency     Barriers to discharge        Co-evaluation               AM-PAC PT "6 Clicks" Daily Activity  Outcome Measure Difficulty turning over in bed (including adjusting bedclothes, sheets and blankets)?: None Difficulty moving from lying on back to sitting on the side of the bed? : None Difficulty sitting down on and standing up from a chair with arms (e.g., wheelchair, bedside commode, etc,.)?: None Help needed moving to and from a bed to chair (including a wheelchair)?: None Help needed walking in hospital room?: None Help needed climbing 3-5 steps with a railing? : A Little 6 Click Score: 23    End of Session   Activity Tolerance: Patient tolerated treatment well;Other (comment) (Lightheadedness) Patient left: in chair;with call bell/phone within reach;with nursing/sitter in room Nurse Communication: Mobility status (Recommend OP PT for Vest Rehab) PT Visit Diagnosis: Dizziness and giddiness (R42)    Time: 8828-0034 PT Time Calculation (min) (ACUTE ONLY): 36 min   Charges:   PT Evaluation $PT Eval Moderate Complexity: 1 Mod PT Treatments $Therapeutic Activity: 8-22 mins   PT G Codes:        Carita Pian. Sanjuana Kava, [facility] Acute Rehab Services Pager [place] 01/01/2017, 2:48 PM

## 2017-01-01 NOTE — Discharge Summary (Signed)
Physician Discharge Summary  Jeffery Ray ZOX:[id number] DOB: 30-Dec-1980 DOA: 12/28/2016  PCP: Patient, No Pcp Per  Admit date: 12/28/2016 Discharge date: 01/01/2017  Time spent: 45 minutes  Recommendations for Outpatient Follow-up:  Patient will be discharged to home with outpatient physical therapy.  Patient will need to follow up with primary care provider within one week of discharge.  Patient should continue medications as prescribed.  Patient should follow a regular diet.   Discharge Diagnoses:  Abnormal Eye movement, possibly vertigo Acute kidney injury versus chronic kidney disease Mild rhabdomyolysis Tobacco use  Discharge Condition: stable  Diet recommendation: regular  Filed Weights   12/29/16 1612 12/30/16 0553  Weight: 86.2 kg (190 lb) 85.7 kg (188 lb 15 oz)    History of present illness:  on 12/28/2016 by Dr. Elwin Sleight Harrisis a 36 y.o.malewith medical history significant of tobacco abuse, diet-controlled diabetes, who presents with seizure-like movement. Per pt's brother, pt suddenly became unresponsive with rapid eye movement at work at about 8 PM. No extremity jerking movement.  On arrival to the emergency room, pt was again unresponsive with nystagmus and possible facial twitching per EDP. No past history of seizure. Wife states that pthad "heart attack" 6 years ago. His wife reports that he does not use illegal drugs or alcohol. Pt was treated withIV Ativan and loaded with Keppra. Does not follow simple commands. Patient opens eyes to verbal stimulus, but dose not answer any questions. Per his wife, patient had 1 episode of loose stool bowel movement just before going to work at about 6:30 PM. Patient told his wife, he did not feel good earlytoday. Wife does not think patient has chest pain, SOB or cough. Patient does not have fever or chills. Currently no active nausea, vomiting or diarrhea noted. He moves all extremities normally. No facial  droop.  Hospital Course:  Abnormal Eye movement, possibly vertigo -Unknown etiology -Seizure ruled out -No chest x-ray or UA done on admission to rule out infectious etiology. However patient afebrile with no leukocytosis. No complaints of cough or shortness of breath or changes with urinary habits. -UDS positive for benzodiazepines -CT head negative for acute intracranial maladies -MRI brain: Normal -EEG unremarkable/ Continuous EEG no epileptiform discharges clinical or subclinical seziures -Neurology consulted and appreciated -Overnight on 8/9-10, patient had some abnormal eye movement, likely nystagmus -Continuous EEG monitoring showed no epileptiform discharges or subclinical seizures -Discussed with Dr. Amada Jupiter, keppra discontinued. Seizure ruled out. Possible due to vertigo. 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  -Unknown baseline. 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 Denies chest pain, shortness of breath, abdominal pain, N/V/D/C, dizziness, headache.   General: Well developed, well nourished, NAD, appears stated age  HEENT: NCAT, mucous membranes moist. Right  eye  blindness   Cardiovascular: S1 S2 auscultated, RRR, no murmurs  Respiratory: Clear to auscultation bilaterally with equal chest rise  Abdomen: Soft, nontender, nondistended, + bowel sounds  Extremities: warm dry without cyanosis clubbing or edema  Neuro: AAOx3, nonfocal, right eye blindness  Psych: Normal affect and demeanor with intact judgement and insight, pleasant   Discharge Instructions Discharge Instructions    Ambulatory referral to Physical Therapy    Complete by:  As directed    Discharge instructions    Complete by:  As directed    Patient will be discharged to home with outpatient physical therapy.  Patient will need to follow up with primary care provider within one week of discharge.  Patient should continue medications as prescribed.  Patient should follow a regular diet.     Current Discharge Medication List    START taking these medications   Details  nicotine (NICODERM CQ - DOSED IN MG/24 HOURS) 21 mg/24hr patch Place 1 patch (21 mg total) onto the skin daily. Qty: 28 patch, Refills: 0       Allergies  Allergen Reactions  . Fish Allergy Anaphylaxis  . Shellfish Allergy    Follow-up Information    primary care physician. Schedule an appointment as soon as possible for a visit in 1 week(s).   Why:  Hospital follow up           The results of significant diagnostics from this hospitalization (including imaging, microbiology, ancillary and laboratory) are listed below for reference.    Significant Diagnostic Studies: Ct Angio Head W Or Wo Contrast  Result Date: 12/31/2016 CLINICAL DATA:  Dizziness EXAM: CT ANGIOGRAPHY HEAD AND NECK TECHNIQUE: Multidetector CT imaging of the head and neck was performed using the standard protocol during bolus administration of intravenous contrast. Multiplanar CT image reconstructions and MIPs were obtained to evaluate the vascular anatomy. Carotid stenosis measurements (when applicable) are obtained utilizing NASCET  criteria, using the distal internal carotid diameter as the denominator. CONTRAST:  50 mL Isovue 370 IV COMPARISON:  MRI 12/29/2016 FINDINGS: CT HEAD FINDINGS Brain: No evidence of acute infarction, hemorrhage, hydrocephalus, extra-axial collection or mass lesion/mass effect. Vascular: No hyperdense vessel or unexpected calcification. Skull: No acute abnormality. 10 x 15 mm exostosis projecting posterior to the left temporal bone, this appears benign Sinuses: Negative Orbits: Negative Review of the MIP images confirms the above findings CTA NECK FINDINGS Aortic arch: Normal. Negative for atherosclerotic disease or aneurysm. Proximal great vessels normal Right carotid system: Normal right carotid system. Negative for atherosclerotic disease or dissection Left carotid system: Normal left carotid system. Negative for atherosclerotic disease or dissection Vertebral arteries: Normal vertebral arteries bilaterally. Both vertebral arteries contribute to the basilar. Skeleton: Negative Other neck: Negative Upper chest: Negative Review of the MIP images confirms the above findings CTA HEAD FINDINGS Anterior circulation: Cavernous carotid is normal bilaterally without stenosis or aneurysm. Anterior and middle cerebral arteries normal bilaterally. Posterior circulation: Both vertebral artery is patent to the basilar. PICA patent bilaterally. Basilar normal. Superior cerebellar and posterior cerebral arteries widely patent bilaterally. Fetal origin left posterior cerebral artery. Venous sinuses: Normal Anatomic variants: None Delayed phase: Normal enhancement postcontrast infusion. No enhancing mass lesion. Review of the MIP images confirms the above findings IMPRESSION: Normal CTA head and neck Electronically Signed   By: Marlan Palau M.D.   On: 12/31/2016 21:13   Ct Head Wo Contrast  Result Date: 12/28/2016 CLINICAL DATA:  Seizure today. EXAM: CT HEAD WITHOUT CONTRAST TECHNIQUE: Contiguous axial  images were obtained from  the base of the skull through the vertex without intravenous contrast. COMPARISON:  Brain MRI, 11/11/2016 FINDINGS: Brain: No evidence of acute infarction, hemorrhage, hydrocephalus, extra-axial collection or mass lesion/mass effect. Vascular: No hyperdense vessel or unexpected calcification. Skull: Normal. Negative for fracture or focal lesion. Sinuses/Orbits: Globes and orbits are unremarkable. Sinuses and mastoid air cells are clear. Other: None. IMPRESSION: Normal unenhanced CT scan of the brain. Electronically Signed   By: Amie Portland M.D.   On: 12/28/2016 21:48   Ct Angio Neck W Or Wo Contrast  Result Date: 12/31/2016 CLINICAL DATA:  Dizziness EXAM: CT ANGIOGRAPHY HEAD AND NECK TECHNIQUE: Multidetector CT imaging of the head and neck was performed using the standard protocol during bolus administration of intravenous contrast. Multiplanar CT image reconstructions and MIPs were obtained to evaluate the vascular anatomy. Carotid stenosis measurements (when applicable) are obtained utilizing NASCET criteria, using the distal internal carotid diameter as the denominator. CONTRAST:  50 mL Isovue 370 IV COMPARISON:  MRI 12/29/2016 FINDINGS: CT HEAD FINDINGS Brain: No evidence of acute infarction, hemorrhage, hydrocephalus, extra-axial collection or mass lesion/mass effect. Vascular: No hyperdense vessel or unexpected calcification. Skull: No acute abnormality. 10 x 15 mm exostosis projecting posterior to the left temporal bone, this appears benign Sinuses: Negative Orbits: Negative Review of the MIP images confirms the above findings CTA NECK FINDINGS Aortic arch: Normal. Negative for atherosclerotic disease or aneurysm. Proximal great vessels normal Right carotid system: Normal right carotid system. Negative for atherosclerotic disease or dissection Left carotid system: Normal left carotid system. Negative for atherosclerotic disease or dissection Vertebral arteries: Normal vertebral arteries bilaterally.  Both vertebral arteries contribute to the basilar. Skeleton: Negative Other neck: Negative Upper chest: Negative Review of the MIP images confirms the above findings CTA HEAD FINDINGS Anterior circulation: Cavernous carotid is normal bilaterally without stenosis or aneurysm. Anterior and middle cerebral arteries normal bilaterally. Posterior circulation: Both vertebral artery is patent to the basilar. PICA patent bilaterally. Basilar normal. Superior cerebellar and posterior cerebral arteries widely patent bilaterally. Fetal origin left posterior cerebral artery. Venous sinuses: Normal Anatomic variants: None Delayed phase: Normal enhancement postcontrast infusion. No enhancing mass lesion. Review of the MIP images confirms the above findings IMPRESSION: Normal CTA head and neck Electronically Signed   By: Marlan Palau M.D.   On: 12/31/2016 21:13   Mr Laqueta Jean And Wo Contrast  Result Date: 12/29/2016 CLINICAL DATA:  New onset seizure at work.  Nonverbal, unresponsive. EXAM: MRI HEAD WITHOUT AND WITH CONTRAST TECHNIQUE: Multiplanar, multiecho pulse sequences of the brain and surrounding structures were obtained without and with intravenous contrast. CONTRAST:  15mL MULTIHANCE GADOBENATE DIMEGLUMINE 529 MG/ML IV SOLN COMPARISON:  CT HEAD December 28, 2016 and MRI of the head November 11, 2016 FINDINGS: INTRACRANIAL CONTENTS: No reduced diffusion to suggest acute ischemia. No susceptibility artifact to suggest hemorrhage. The ventricles and sulci are normal for patient's age. No suspicious parenchymal signal, masses, mass effect. No abnormal intraparenchymal or extra-axial enhancement. No abnormal extra-axial fluid collections. No extra-axial masses. VASCULAR: Normal major intracranial vascular flow voids present at skull base. SKULL AND UPPER CERVICAL SPINE: No abnormal sellar expansion. No suspicious calvarial bone marrow signal. Craniocervical junction maintained. SINUSES/ORBITS: Trace RIGHT mastoid effusion. Paranasal  sinuses are well-aerated. The included ocular globes and orbital contents are non-suspicious. OTHER: None. IMPRESSION: Normal MRI of the head with and without contrast. Electronically Signed   By: Awilda Metro M.D.   On: 12/29/2016 01:50    Microbiology: No results  found for this or any previous visit (from the past 240 hour(s)).   Labs: Basic Metabolic Panel:  Recent Labs Lab 12/28/16 2110 12/29/16 0313 12/30/16 0401 12/31/16 0453 01/01/17 0508  NA 144 143 140 140 140  K 3.9 3.8 3.5 3.5 3.7  CL 106 111 107 107 107  CO2  --  26 27 25 28   GLUCOSE 79 86 109* 92 103*  BUN 23* 15 15 13 11   CREATININE 1.40* 1.25* 1.37* 1.26* 1.27*  CALCIUM  --  8.6* 8.3* 8.4* 8.5*   Liver Function Tests:  Recent Labs Lab 12/28/16 2052  AST 30  ALT 29  ALKPHOS 77  BILITOT 1.3*  PROT 6.0*  ALBUMIN 3.6   No results for input(s): LIPASE, AMYLASE in the last 168 hours. No results for input(s): AMMONIA in the last 168 hours. CBC:  Recent Labs Lab 12/28/16 2052 12/28/16 2110 12/29/16 0454 12/30/16 0401 12/31/16 0453  WBC 5.9  --  5.3 6.3 5.8  NEUTROABS 2.9  --   --   --   --   HGB 14.3 15.6 13.6 13.3 14.0  HCT 42.8 46.0 39.7 39.6 41.3  MCV 90.1  --  88.6 90.0 87.9  PLT 169  --  152 147* 160   Cardiac Enzymes:  Recent Labs Lab 12/28/16 2052 12/29/16 0313  CKTOTAL 449* 306   BNP: BNP (last 3 results) No results for input(s): BNP in the last 8760 hours.  ProBNP (last 3 results) No results for input(s): PROBNP in the last 8760 hours.  CBG:  Recent Labs Lab 12/29/16 2336 12/30/16 0849 12/31/16 0856 01/01/17 0837  GLUCAP 103* 89 137* 74       Signed:  Sharman Garrott  [facility] 01/01/2017, 1:42 PM

## 2017-01-01 NOTE — Progress Notes (Signed)
CM met with pt in room to explain MD is ordering outpt vestibular Rehab.  Pt verbalized understanding he will receive a call from the [place] to schedule an appointment.

## 2017-01-01 NOTE — Progress Notes (Signed)
Weston SettleHenry Manske to be D/C'd Home per MD order.  Discussed with the patient and all questions fully answered.  VSS, Skin clean, dry and intact without evidence of skin break down, no evidence of skin tears noted. IV catheter discontinued intact. Site without signs and symptoms of complications. Dressing and pressure applied.  An After Visit Summary was printed and given to the patient. Patient received prescription.  D/c education completed with patient/family including follow up instructions, medication list, d/c activities limitations if indicated, with other d/c instructions as indicated by MD - patient able to verbalize understanding, all questions fully answered.   Patient instructed to return to ED, call 911, or call MD for any changes in condition.   Patient escorted via WC, and D/C home via private auto.  Evern BioLoren D Dovie Kapusta 01/01/2017 7:00 PM

## 2017-01-01 NOTE — Progress Notes (Signed)
Subjective: [name] was sitting on the edge of the bed on my arrival. He states that he is feeling well and is ready to go home. No reported dizziness, blurred vision or headache. No dizziness with lateral movement of the head or standing.  Current Pertinent Medications: 500mg  Keppra BID  Pertinent Labs/Diagnostics: EEG negative MR brain negative CT head negative cEEG: No epileptiform discharges clinical or subclinical seizures  CTA head/neck: negative  Physical Examination: Vitals:   12/31/16 2109 01/01/17 0645  BP: 125/81 102/74  Pulse: 73 76  Resp:    Temp: 98.5 F (36.9 C) 97.8 F (36.6 C)  SpO2: 100% 99%    General: WDWN male.  HEENT: Normocephalic, no lesions, without obvious abnormality. Normal external eye and conjunctiva. Normal external ears. Normal external nose, mucus membranes and septum. Normal pharynx. Pulmonary: Unlabored breathing Abdomen: Soft Extremities: no joint deformities Musculoskeletal: Tone and bulk normal throughout; no atrophy noted  Neurological Examination: ZO:[id number]:Blind in the right eye, outwardly deviated. Left pupil round and reactive. EOMI with some end-gaze nystagmus bilaterally. Facial sensation is intact to light touch. Face is symmetric at rest with normal strength and mobility. Hearing is intact to conversational voice. Palate elevates symmetrically and uvula is midline. Voice is normal in tone, pitch and quality. Tongue is midline with normal bulk and mobility.  Motor:Normal bulk, tone, and strength. 5/5 throughout.No drift.  Sensation: Intact to light touch.  DTRs:2+ symmetric Coordination: Finger-to-nose without dysmetria. Romberg test negative.   Assessment and recommendations per attending neurologist.  Jeffery PotterJamie Aldridge PA-C [facility] [phone]  01/01/2017, 7:45 AM  I have seen the patient reviewed the above-noted.  Assessment:  36 year old male with recurrent episodes of "eye jumping" dizziness and  lightheadedness. There is no evidence of seizure with the spells that were captured on EEG. There is also no evidence of posterior surgery relation insufficiency. Possibilities include BPPV, psychogenic episodes. No further conditions at this time.  Recommendations: 1) could consider vestibular rehabilitation 2) please call with any questions or concerns.

## 2018-04-18 IMAGING — CT CT ANGIO NECK
1 of 12 series · 5 of 33 positions shown · IV contrast (OMNI 350)
Comparison: MRI 12/29/2016

CLINICAL DATA: Dizziness

EXAM:
CT ANGIOGRAPHY HEAD AND NECK
TECHNIQUE: Multidetector CT imaging of the head and neck was performed using
the standard protocol during bolus administration of intravenous
contrast. Multiplanar CT image reconstructions and MIPs were
obtained to evaluate the vascular anatomy. Carotid stenosis
measurements (when applicable) are obtained utilizing NASCET
criteria, using the distal internal carotid diameter as the
denominator.
CONTRAST:  50 mL Isovue 370 IV

[Series 12: cta neck axial · axial · 0.39mm/px · z∈[-252,+16]mm · 5 of 404 slices shown]
[im 68/404  soft-tissue]
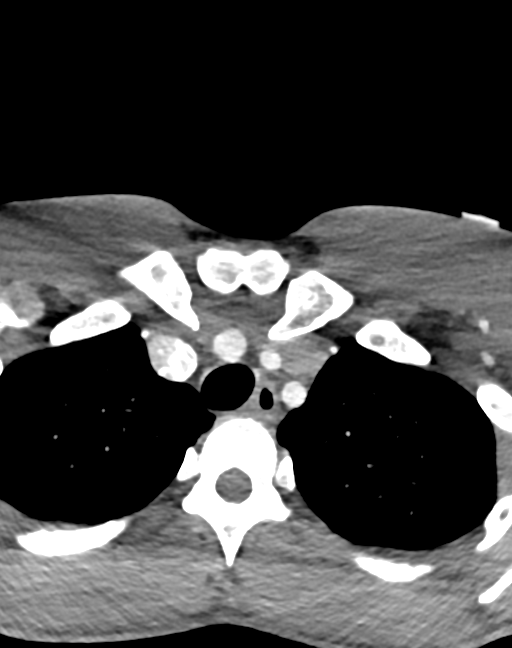
[im 135/404  bone]
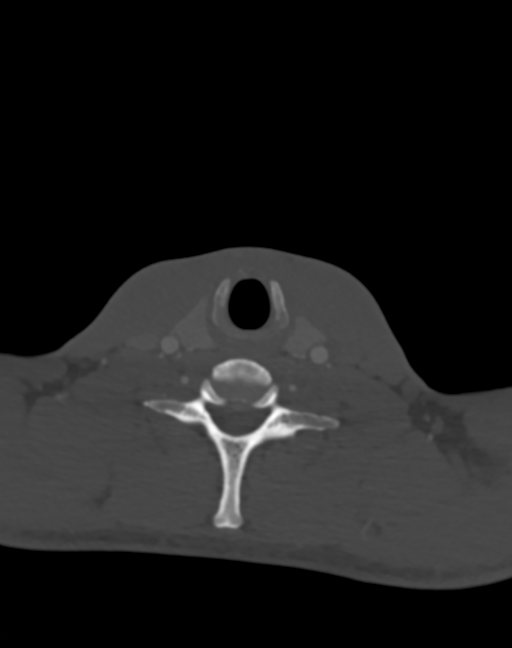
[im 202/404  soft-tissue]
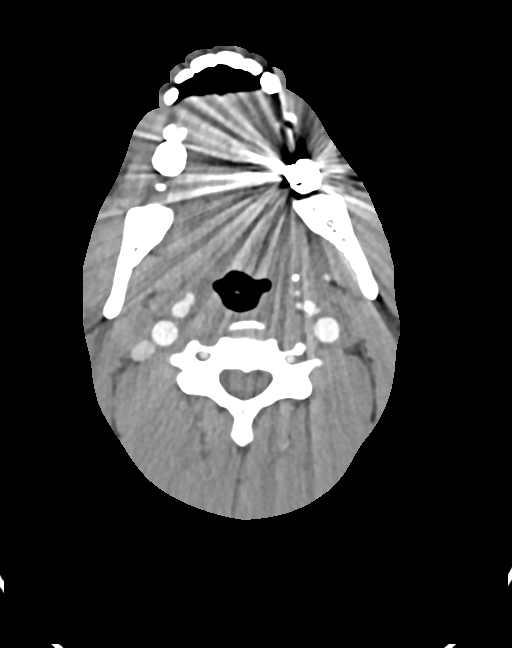
[im 269/404  bone]
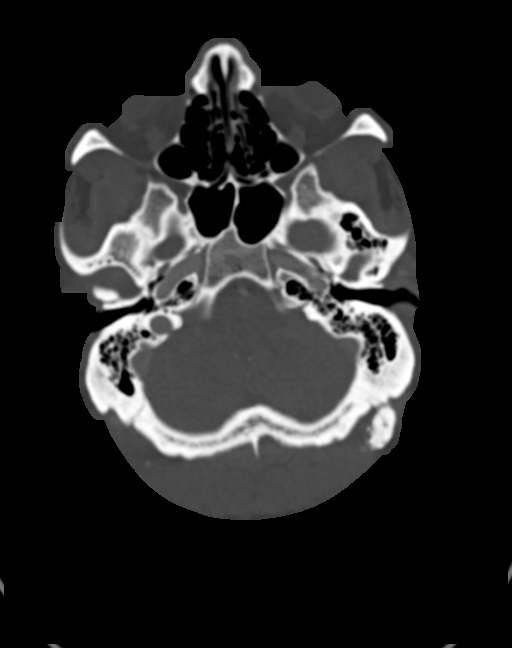
[im 336/404  soft-tissue]
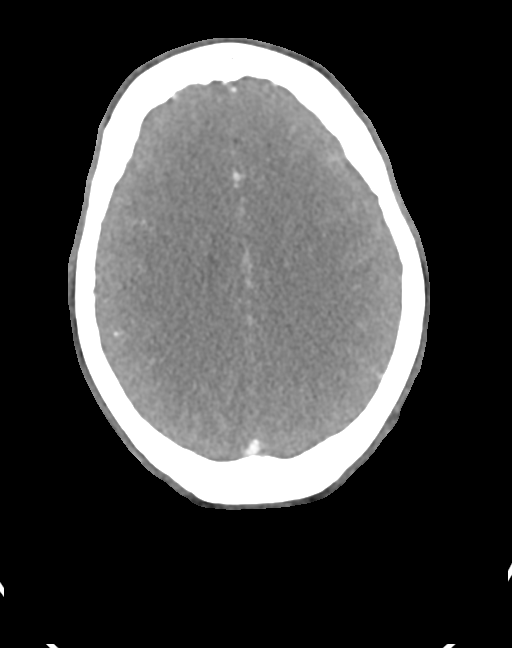

[5 of 33 positions shown; findings below may reference images not displayed]

FINDINGS: CT HEAD FINDINGS

Brain: No evidence of acute infarction, hemorrhage, hydrocephalus,
extra-axial collection or mass lesion/mass effect.

Vascular: No hyperdense vessel or unexpected calcification.

Skull: No acute abnormality. 10 x 15 mm exostosis projecting
posterior to the left temporal bone, this appears benign

Sinuses: Negative

Orbits: Negative

Review of the MIP images confirms the above findings

CTA NECK FINDINGS

Aortic arch: Normal. Negative for atherosclerotic disease or
aneurysm. Proximal great vessels normal

Right carotid system: Normal right carotid system. Negative for
atherosclerotic disease or dissection

Left carotid system: Normal left carotid system. Negative for
atherosclerotic disease or dissection

Vertebral arteries: Normal vertebral arteries bilaterally. Both
vertebral arteries contribute to the basilar.

Skeleton: Negative

Other neck: Negative

Upper chest: Negative

Review of the MIP images confirms the above findings

CTA HEAD FINDINGS

Anterior circulation: Cavernous carotid is normal bilaterally
without stenosis or aneurysm. Anterior and middle cerebral arteries
normal bilaterally.

Posterior circulation: Both vertebral artery is patent to the
basilar. PICA patent bilaterally. Basilar normal. Superior
cerebellar and posterior cerebral arteries widely patent
bilaterally. Fetal origin left posterior cerebral artery.

Venous sinuses: Normal

Anatomic variants: None

Delayed phase: Normal enhancement postcontrast infusion. No
enhancing mass lesion.

Review of the MIP images confirms the above findings
IMPRESSION: Normal CTA head and neck

## 2019-02-22 ENCOUNTER — Other Ambulatory Visit: Payer: Self-pay

## 2019-02-22 ENCOUNTER — Emergency Department (HOSPITAL_BASED_OUTPATIENT_CLINIC_OR_DEPARTMENT_OTHER)
Admission: EM | Admit: 2019-02-22 | Discharge: 2019-02-22 | Disposition: A | Discharge status: Home / Self Care | Payer: 59 | Attending: Emergency Medicine | Admitting: Emergency Medicine

## 2019-02-22 ENCOUNTER — Encounter (HOSPITAL_BASED_OUTPATIENT_CLINIC_OR_DEPARTMENT_OTHER): Payer: Self-pay | Admitting: Emergency Medicine

## 2019-02-22 DIAGNOSIS — E119 Type 2 diabetes mellitus without complications: Secondary | ICD-10-CM | POA: Diagnosis not present

## 2019-02-22 DIAGNOSIS — B351 Tinea unguium: Secondary | ICD-10-CM | POA: Diagnosis not present

## 2019-02-22 DIAGNOSIS — B353 Tinea pedis: Secondary | ICD-10-CM

## 2019-02-22 DIAGNOSIS — L02612 Cutaneous abscess of left foot: Secondary | ICD-10-CM

## 2019-02-22 DIAGNOSIS — M79675 Pain in left toe(s): Secondary | ICD-10-CM | POA: Diagnosis present

## 2019-02-22 DIAGNOSIS — F17210 Nicotine dependence, cigarettes, uncomplicated: Secondary | ICD-10-CM | POA: Diagnosis not present

## 2019-02-22 DIAGNOSIS — L03032 Cellulitis of left toe: Secondary | ICD-10-CM | POA: Diagnosis not present

## 2019-02-22 MED ORDER — KETOCONAZOLE 2 % EX CREA: 1.0000 "application " | TOPICAL_CREAM | Freq: Two times a day (BID) | CUTANEOUS | 0 refills | Status: AC

## 2019-02-22 MED ORDER — CEPHALEXIN 500 MG PO CAPS: 500.0000 mg | ORAL_CAPSULE | Freq: Four times a day (QID) | ORAL | 0 refills | Status: AC

## 2019-02-22 MED ORDER — CEPHALEXIN 500 MG PO CAPS: 500.0000 mg | ORAL_CAPSULE | Freq: Four times a day (QID) | ORAL | 0 refills | Status: DC

## 2019-02-22 MED ORDER — KETOCONAZOLE 2 % EX CREA: 1.0000 "application " | TOPICAL_CREAM | Freq: Two times a day (BID) | CUTANEOUS | 0 refills | Status: DC

## 2019-02-22 NOTE — Discharge Instructions (Addendum)
Please use ketoconazole cream twice daily for one month on both feet Take Keflex 4 times daily and apply a bandage over the sore area to prevent toes from rubbing Continue to wash feet once daily and air dry as much as possible Please follow up with podiatry

## 2019-02-22 NOTE — ED Provider Notes (Signed)
[facility] EMERGENCY DEPARTMENT Provider Note   CSN: [id number] Arrival date & time: 02/22/19  4627     History   Chief Complaint Chief Complaint  Patient presents with  . Toe Pain    HPI Jeffery Ray is a 38 y.o. male who presents with toe pain. PMH significant for hx of seizures, depression, diabetes.  He reports that over the past 3 days he has had gradually worsening pain over his left great toe.  The pain is over the top of the toe and the lateral aspect of the great toe. He states that the pain shoots up from his foot all the way to his chest.  Pain is constant and worse with walking.  He works multiple jobs and is on his feet for most of the day.  He has seen podiatry before for tinea pedis and onychomycosis several years ago.  He states that he cleans his feet every day and areas them out however the fungal infection has not improving.  He is not using any antifungal medication.  He is not wrapping his toe.    HPI  Past Medical History:  Diagnosis Date  . Diabetes mellitus without complication ([place])   . Tobacco abuse     Patient Active Problem List   Diagnosis Date Noted  . Seizure ([place]) 12/28/2016  . Tobacco abuse 12/28/2016  . AKI (acute kidney injury) ([place]) 12/28/2016  . Rhabdomyolysis 12/28/2016    Past Surgical History:  Procedure Laterality Date  . CORNEAL TRANSPLANT    . HERNIA REPAIR          Home Medications    Prior to Admission medications   Medication Sig Start Date End Date Taking? Authorizing Provider  nicotine (NICODERM CQ - DOSED IN MG/24 HOURS) 21 mg/24hr patch Place 1 patch (21 mg total) onto the skin daily. 01/02/17   Cristal Ford, DO    Family History Family History  Problem Relation Age of Onset  . Diabetes Mellitus II Mother     Social History Social History   Tobacco Use  . Smoking status: Current Every Day Smoker    Packs/day: 1.00    Years: 10.00    Pack years: 10.00    Types: Cigarettes  . Smokeless  tobacco: Never Used  Substance Use Topics  . Alcohol use: No  . Drug use: No     Allergies   Fish allergy and Shellfish allergy   Review of Systems Review of Systems  Constitutional: Negative for fever.  Musculoskeletal: Positive for arthralgias.  Skin: Positive for wound.     Physical Exam Updated Vital Signs There were no vitals taken for this visit.  Physical Exam Vitals signs and nursing note reviewed.  Constitutional:      General: He is not in acute distress.    Appearance: Normal appearance. He is well-developed. He is not ill-appearing.  HENT:     Head: Normocephalic and atraumatic.  Eyes:     General: No scleral icterus.       Right eye: No discharge.        Left eye: No discharge.     Conjunctiva/sclera: Conjunctivae normal.     Pupils: Pupils are equal, round, and reactive to light.  Neck:     Musculoskeletal: Normal range of motion.  Cardiovascular:     Rate and Rhythm: Normal rate.  Pulmonary:     Effort: Pulmonary effort is normal. No respiratory distress.  Abdominal:     General: There is no distension.  Musculoskeletal:     Comments: Tinea pedis and onychomycosis of the bilateral feet. There is a focal area of redness, tenderness, and a small blister over the lateral aspect of the L great toe where the great toe and 2nd toe have been rubbing together. 2+ DP pulse.   Skin:    General: Skin is warm and dry.  Neurological:     Mental Status: He is alert and oriented to person, place, and time.  Psychiatric:        Behavior: Behavior normal.      ED Treatments / Results  Labs (all labs ordered are listed, but only abnormal results are displayed) Labs Reviewed - No data to display  EKG None  Radiology No results found.  Procedures Procedures (including critical care time)  Medications Ordered in ED Medications - No data to display   Initial Impression / Assessment and Plan / ED Course  I have reviewed the triage vital signs and the  nursing notes.  Pertinent labs & imaging results that were available during my care of the patient were reviewed by me and considered in my medical decision making (see chart for details).  38 year old male presents with left great toe pain for 3 days.  He has a focal skin infection with possible small abscess over the lateral aspect of the great toe.  He also has extensive fungal infection of the foot and toe nails.  Shared visit with Dr. Renaye Rakers.  Will not I&D the area today due to history of diabetes and area being close to the joint space.  We will start him on Keflex and prescribed ketoconazole cream which he can use twice daily for 1 month.  Podiatry follow-up was given.  Final Clinical Impressions(s) / ED Diagnoses   Final diagnoses:  Abscess or cellulitis of toe, left  Tinea pedis of left foot  Onychomycosis    ED Discharge Orders    None       Bethel Born, PA-C 02/22/19 1039    Terald Sleeper, MD 02/22/19 [phone]

## 2019-02-22 NOTE — ED Notes (Signed)
PT wished to apply his own dressing at home.

## 2019-02-22 NOTE — ED Triage Notes (Signed)
L great toe pain x 2 days. Denies injury 

## 2020-07-31 ENCOUNTER — Other Ambulatory Visit: Payer: Self-pay

## 2020-07-31 ENCOUNTER — Emergency Department (HOSPITAL_BASED_OUTPATIENT_CLINIC_OR_DEPARTMENT_OTHER)
Admission: EM | Admit: 2020-07-31 | Discharge: 2020-07-31 | Disposition: A | Discharge status: Home / Self Care | Payer: 59 | Attending: Emergency Medicine | Admitting: Emergency Medicine

## 2020-07-31 ENCOUNTER — Encounter (HOSPITAL_BASED_OUTPATIENT_CLINIC_OR_DEPARTMENT_OTHER): Payer: Self-pay | Admitting: Emergency Medicine

## 2020-07-31 DIAGNOSIS — S0990XA Unspecified injury of head, initial encounter: Secondary | ICD-10-CM | POA: Diagnosis present

## 2020-07-31 DIAGNOSIS — W228XXA Striking against or struck by other objects, initial encounter: Secondary | ICD-10-CM | POA: Insufficient documentation

## 2020-07-31 DIAGNOSIS — E119 Type 2 diabetes mellitus without complications: Secondary | ICD-10-CM | POA: Insufficient documentation

## 2020-07-31 DIAGNOSIS — F17210 Nicotine dependence, cigarettes, uncomplicated: Secondary | ICD-10-CM | POA: Insufficient documentation

## 2020-07-31 DIAGNOSIS — S0181XA Laceration without foreign body of other part of head, initial encounter: Secondary | ICD-10-CM | POA: Diagnosis not present

## 2020-07-31 DIAGNOSIS — S01111A Laceration without foreign body of right eyelid and periocular area, initial encounter: Secondary | ICD-10-CM | POA: Insufficient documentation

## 2020-07-31 MED ORDER — METHOCARBAMOL 500 MG PO TABS
500.0000 mg | ORAL_TABLET | Freq: Once | ORAL | Status: AC
  Administered 2020-07-31: 500 mg via ORAL
  Filled 2020-07-31: qty 1

## 2020-07-31 MED ORDER — LIDOCAINE-EPINEPHRINE-TETRACAINE (LET) TOPICAL GEL
3.0000 mL | Freq: Once | TOPICAL | Status: AC
  Administered 2020-07-31: 3 mL via TOPICAL
  Filled 2020-07-31: qty 3

## 2020-07-31 MED ORDER — ACETAMINOPHEN 500 MG PO TABS
1000.0000 mg | ORAL_TABLET | Freq: Once | ORAL | Status: AC
  Administered 2020-07-31: 1000 mg via ORAL
  Filled 2020-07-31: qty 2
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
## 2022-11-03 ENCOUNTER — Emergency Department (HOSPITAL_BASED_OUTPATIENT_CLINIC_OR_DEPARTMENT_OTHER)
Admission: EM | Admit: 2022-11-03 | Discharge: 2022-11-03 | Disposition: A | Discharge status: Home / Self Care | Payer: 59 | Attending: Emergency Medicine | Admitting: Emergency Medicine

## 2022-11-03 ENCOUNTER — Encounter (HOSPITAL_BASED_OUTPATIENT_CLINIC_OR_DEPARTMENT_OTHER): Payer: Self-pay | Admitting: Urology

## 2022-11-03 ENCOUNTER — Other Ambulatory Visit: Payer: Self-pay

## 2022-11-03 DIAGNOSIS — H9202 Otalgia, left ear: Secondary | ICD-10-CM | POA: Insufficient documentation

## 2022-11-03 DIAGNOSIS — K0889 Other specified disorders of teeth and supporting structures: Secondary | ICD-10-CM | POA: Insufficient documentation

## 2022-11-03 MED ORDER — AMOXICILLIN 500 MG PO CAPS: 500.0000 mg | ORAL_CAPSULE | Freq: Three times a day (TID) | ORAL | 0 refills | Status: AC

## 2022-11-03 NOTE — ED Triage Notes (Signed)
Pt states left ear pain that started yesterday as tooth ache and now worsening Took ibuprofen with no relief

## 2022-11-03 NOTE — Discharge Instructions (Signed)
Please keep your appoint with your dentist.  Please take antibiotics as prescribed.  You may return to the emergency permit for any worsening symptoms.

## 2022-11-03 NOTE — ED Provider Notes (Signed)
[facility] AT [facility] Provider Note   CSN: [id number] Arrival date & time: 11/03/22  1857     History Chief Complaint  Patient presents with   Otalgia    Jeffery Ray is a 42 y.o. male patient who presents to the emergency department today for further evaluation of the left upper dental pain and left ear pain.  This started roughly 1 to 2 days ago.  This started upper dental pain and then started referring up to the left ear now really his left ear is painful.  He denies any fever, chills, trouble speaking, trouble breathing, changes in hearing.  Patient does have an appointment scheduled with his dentist soon.  Otalgia      Home Medications Prior to Admission medications   Medication Sig Start Date End Date Taking? Authorizing Provider  amoxicillin (AMOXIL) 500 MG capsule Take 1 capsule (500 mg total) by mouth 3 (three) times daily. 11/03/22  Yes Meredeth Ide, Derisha Funderburke M, PA-C  cephALEXin (KEFLEX) 500 MG capsule Take 1 capsule (500 mg total) by mouth 4 (four) times daily. 02/22/19   Bethel Born, PA-C  ketoconazole (NIZORAL) 2 % cream Apply 1 application topically 2 (two) times daily. 02/22/19   Bethel Born, PA-C  nicotine (NICODERM CQ - DOSED IN MG/24 HOURS) 21 mg/24hr patch Place 1 patch (21 mg total) onto the skin daily. 01/02/17   Edsel Petrin, DO      Allergies    Fish allergy and Shellfish allergy    Review of Systems   Review of Systems  HENT:  Positive for ear pain.   All other systems reviewed and are negative.   Physical Exam Updated Vital Signs BP (!) 114/92 (BP Location: Left Arm)   Pulse 86   Temp 98.4 F (36.9 C) (Oral)   Resp 16   Ht 6' (1.829 m)   Wt 83.9 kg   SpO2 100%   BMI 25.09 kg/m  Physical Exam Vitals and nursing note reviewed.  Constitutional:      Appearance: Normal appearance.  HENT:     Head: Normocephalic and atraumatic.     Right Ear: Tympanic membrane, ear canal and external ear normal.      Left Ear: Tympanic membrane, ear canal and external ear normal.     Mouth/Throat:     Comments: There is definitely dental caries in the upper left molars.  There is evidence of an avulsed tooth as well.  Gums are in normal appearance and not swollen or purulent.  No swelling of the oral floor. Eyes:     General:        Right eye: No discharge.        Left eye: No discharge.     Conjunctiva/sclera: Conjunctivae normal.  Pulmonary:     Effort: Pulmonary effort is normal.  Skin:    General: Skin is warm and dry.     Findings: No rash.  Neurological:     General: No focal deficit present.     Mental Status: He is alert.  Psychiatric:        Mood and Affect: Mood normal.        Behavior: Behavior normal.     ED Results / Procedures / Treatments   Labs (all labs ordered are listed, but only abnormal results are displayed) Labs Reviewed - No data to display  EKG None  Radiology No results found.  Procedures Procedures    Medications Ordered in ED Medications - No  data to display  ED Course/ Medical Decision Making/ A&P   {   Click here for ABCD2, HEART and other calculators  Medical Decision Making Jeffery Ray is a 42 y.o. male patient who presents to the emergency department today for further evaluation of dental pain and left ear pain.  Left ear is normal in appearance.  There is no tympanic membrane bulging, rupture, or external canal erythema.  There is some evidence of dental caries which is likely the source of his pain.  Pain in his ear is likely referred pain.  I will give the patient some amoxicillin and he will follow-up with his dentist.  Patient agreeable with this plan.  All questions or concerns addressed.  He is safe for discharge.     Final Clinical Impression(s) / ED Diagnoses Final diagnoses:  Pain, dental    Rx / DC Orders ED Discharge Orders          Ordered    amoxicillin (AMOXIL) 500 MG capsule  3 times daily        11/03/22 2025               Honor Loh [place], [place] 11/03/22 2025    Vanetta Mulders, MD 11/07/22 1212
